# Patient Record
Sex: Male | Born: 1995 | Race: Black or African American | Hispanic: No | Marital: Single | Smoking: Former smoker
Health system: Southern US, Community
[De-identification: ages and names within clinical notes are randomized; demographics above are authoritative.]

## PROBLEM LIST (undated history)

## (undated) DIAGNOSIS — F79 Unspecified intellectual disabilities: Secondary | ICD-10-CM

## (undated) DIAGNOSIS — F909 Attention-deficit hyperactivity disorder, unspecified type: Secondary | ICD-10-CM

---
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
## 2013-01-29 ENCOUNTER — Emergency Department (HOSPITAL_COMMUNITY)
Admission: EM | Admit: 2013-01-29 | Discharge: 2013-01-29 | Disposition: A | Discharge status: Home / Self Care | Payer: Medicaid Other | Attending: Emergency Medicine | Admitting: Emergency Medicine

## 2013-01-29 ENCOUNTER — Encounter (HOSPITAL_COMMUNITY): Payer: Self-pay | Admitting: Emergency Medicine

## 2013-01-29 DIAGNOSIS — F79 Unspecified intellectual disabilities: Secondary | ICD-10-CM | POA: Insufficient documentation

## 2013-01-29 DIAGNOSIS — F919 Conduct disorder, unspecified: Secondary | ICD-10-CM | POA: Insufficient documentation

## 2013-01-29 DIAGNOSIS — Z79899 Other long term (current) drug therapy: Secondary | ICD-10-CM | POA: Insufficient documentation

## 2013-01-29 DIAGNOSIS — F909 Attention-deficit hyperactivity disorder, unspecified type: Secondary | ICD-10-CM | POA: Insufficient documentation

## 2013-01-29 DIAGNOSIS — Z760 Encounter for issue of repeat prescription: Secondary | ICD-10-CM

## 2013-01-29 MED ORDER — METHYLPHENIDATE HCL ER (OSM) 54 MG PO TBCR: 54.0000 mg | EXTENDED_RELEASE_TABLET | ORAL | Status: DC

## 2013-01-29 NOTE — ED Provider Notes (Signed)
CSN: [id number]     Arrival date & time 01/29/13  1711 History   First MD Initiated Contact with Patient 01/29/13 1829    This chart was scribed for Iona Coach, a non-physician practitioner working with Benny Lennert, MD by Lewanda Rife, ED Scribe. This patient was seen in room WTR5/WTR5 and the patient's care was started at 6:52 PM     Chief Complaint  Patient presents with  . Medication Refill   (Consider location/radiation/quality/duration/timing/severity/associated sxs/prior Treatment) The history is provided by the patient. No language interpreter was used.   HPI Comments: Raymond Rosario is a 17 y.o. male who presents to the Emergency Department with PMHx of MR and ADHD for a medication refill of concerta, which he has been out of for over a week. Patient has been on this medication for years. History provided by a group home caretaker. States he has an appointment with a new PCP in 2 weeks. Reports associated behavioral issues since running out.  Patient has no complaints.  Past Medical History  Diagnosis Date  . Mental retardation   . ADHD (attention deficit hyperactivity disorder)    History reviewed. No pertinent past surgical history. No family history on file. History  Substance Use Topics  . Smoking status: Never Smoker   . Smokeless tobacco: Not on file  . Alcohol Use: No    Review of Systems  Psychiatric/Behavioral: Positive for behavioral problems.  All other systems reviewed and are negative.    Allergies  Review of patient's allergies indicates no known allergies.  Home Medications   Current Outpatient Rx  Name  Route  Sig  Dispense  Refill  . desmopressin (DDAVP) 0.2 MG tablet   Oral   Take 0.2-0.4 mg by mouth 2 (two) times daily. Take 1 tablet every morning and 2 tablets every night         . methylphenidate (CONCERTA) 54 MG CR tablet   Oral   Take 1 tablet (54 mg total) by mouth every morning.   15 tablet   0   .  polyethylene glycol (MIRALAX / GLYCOLAX) packet   Oral   Take 17 g by mouth at bedtime.         . risperiDONE (RISPERDAL) 1 MG tablet   Oral   Take 2 mg by mouth at bedtime.         . solifenacin (VESICARE) 5 MG tablet   Oral   Take 10 mg by mouth daily.         . traZODone (DESYREL) 50 MG tablet   Oral   Take 50 mg by mouth at bedtime.          BP 124/70  Pulse 54  Temp(Src) 99 F (37.2 C) (Oral)  Resp 14  SpO2 100% Physical Exam  Nursing note and vitals reviewed. Constitutional: He is oriented to person, place, and time. He appears well-developed and well-nourished. No distress.  HENT:  Head: Normocephalic and atraumatic.  Eyes: EOM are normal.  Neck: Neck supple. No tracheal deviation present.  Cardiovascular: Normal rate and regular rhythm.   No murmur heard. Pulmonary/Chest: Effort normal and breath sounds normal. No respiratory distress.  Musculoskeletal: Normal range of motion.  Neurological: He is alert and oriented to person, place, and time.  Skin: Skin is warm and dry.  Psychiatric: He has a normal mood and affect. His behavior is normal.    ED Course  Procedures (including critical care time)  COORDINATION OF CARE:  Nursing notes  reviewed. Vital signs reviewed. Initial pt interview and examination performed.   6:52 PM-Discussed treatment plan with pt and caregiver at bedside, which includes f/u with new PCP in 2 weeks for future refills. Pt and caregiver agree with plan.    Treatment plan initiated:Medications - No data to display   Initial diagnostic testing ordered.    Labs Review Labs Reviewed - No data to display Imaging Review No results found.  EKG Interpretation   None       MDM   1. Medication refill    Will give 20 tabs of Concerta until able to followup. Patient is currently in no distress and is cooperative and calm. Plan to discharge home.  Filed Vitals:   01/29/13 1730 01/29/13 1912  BP: 124/70 119/63  Pulse:  54 66  Temp: 99 F (37.2 C) 98.2 F (36.8 C)  TempSrc: Oral Oral  Resp: 14 16  SpO2: 100% 97%    I personally performed the services described in this documentation, which was scribed in my presence. The recorded information has been reviewed and is accurate.    Lottie Mussel, PA-C 01/29/13 2038

## 2013-01-29 NOTE — ED Notes (Signed)
Pt. Needs Concerta 54mg  refilled.

## 2013-01-29 NOTE — ED Provider Notes (Signed)
Medical screening examination/treatment/procedure(s) were performed by non-physician practitioner and as supervising physician I was immediately available for consultation/collaboration.  EKG Interpretation   None         Benny Lennert, MD 01/29/13 2210

## 2013-01-29 NOTE — ED Notes (Signed)
Pt waiting for new pt appt with new PCP but not for another two weeks. Pt has been out of his concerta med for a week now.

## 2016-01-16 ENCOUNTER — Emergency Department (HOSPITAL_COMMUNITY)
Admission: EM | Admit: 2016-01-16 | Discharge: 2016-01-17 | Disposition: A | Discharge status: Home / Self Care | Payer: MEDICAID | Attending: Emergency Medicine | Admitting: Emergency Medicine

## 2016-01-16 ENCOUNTER — Encounter (HOSPITAL_COMMUNITY): Payer: Self-pay | Admitting: Emergency Medicine

## 2016-01-16 DIAGNOSIS — F909 Attention-deficit hyperactivity disorder, unspecified type: Secondary | ICD-10-CM | POA: Diagnosis not present

## 2016-01-16 DIAGNOSIS — F6381 Intermittent explosive disorder: Secondary | ICD-10-CM

## 2016-01-16 DIAGNOSIS — F79 Unspecified intellectual disabilities: Secondary | ICD-10-CM | POA: Diagnosis present

## 2016-01-16 DIAGNOSIS — F902 Attention-deficit hyperactivity disorder, combined type: Secondary | ICD-10-CM

## 2016-01-16 LAB — VALPROIC ACID LEVEL: Valproic Acid Lvl: 106 ug/mL — ABNORMAL HIGH (ref 50.0–100.0)

## 2016-01-16 LAB — RAPID URINE DRUG SCREEN, HOSP PERFORMED
AMPHETAMINES: NOT DETECTED
BARBITURATES: NOT DETECTED
Benzodiazepines: NOT DETECTED
Cocaine: NOT DETECTED
Opiates: NOT DETECTED
TETRAHYDROCANNABINOL: NOT DETECTED

## 2016-01-16 LAB — CBC WITH DIFFERENTIAL/PLATELET
BASOS ABS: 0 10*3/uL (ref 0.0–0.1)
BASOS PCT: 0 %
Eosinophils Absolute: 0.1 10*3/uL (ref 0.0–0.7)
Eosinophils Relative: 1 %
HEMATOCRIT: 34.7 % — AB (ref 39.0–52.0)
HEMOGLOBIN: 12.3 g/dL — AB (ref 13.0–17.0)
LYMPHS PCT: 41 %
Lymphs Abs: 2.3 10*3/uL (ref 0.7–4.0)
MCH: 27.6 pg (ref 26.0–34.0)
MCHC: 35.4 g/dL (ref 30.0–36.0)
MCV: 77.8 fL — ABNORMAL LOW (ref 78.0–100.0)
MONOS PCT: 10 %
Monocytes Absolute: 0.6 10*3/uL (ref 0.1–1.0)
NEUTROS ABS: 2.6 10*3/uL (ref 1.7–7.7)
NEUTROS PCT: 48 %
Platelets: 209 10*3/uL (ref 150–400)
RBC: 4.46 MIL/uL (ref 4.22–5.81)
RDW: 14 % (ref 11.5–15.5)
WBC: 5.6 10*3/uL (ref 4.0–10.5)

## 2016-01-16 LAB — COMPREHENSIVE METABOLIC PANEL
ALBUMIN: 3.9 g/dL (ref 3.5–5.0)
ALT: 33 U/L (ref 17–63)
ANION GAP: 6 (ref 5–15)
AST: 30 U/L (ref 15–41)
Alkaline Phosphatase: 57 U/L (ref 38–126)
BILIRUBIN TOTAL: 0.7 mg/dL (ref 0.3–1.2)
BUN: 5 mg/dL — ABNORMAL LOW (ref 6–20)
CHLORIDE: 97 mmol/L — AB (ref 101–111)
CO2: 27 mmol/L (ref 22–32)
Calcium: 8.8 mg/dL — ABNORMAL LOW (ref 8.9–10.3)
Creatinine, Ser: 0.87 mg/dL (ref 0.61–1.24)
GFR calc Af Amer: >60 mL/min (ref >60)
GFR calc non Af Amer: >60 mL/min (ref >60)
GLUCOSE: 80 mg/dL (ref 65–99)
POTASSIUM: 4.2 mmol/L (ref 3.5–5.1)
SODIUM: 130 mmol/L — AB (ref 135–145)
TOTAL PROTEIN: 7.3 g/dL (ref 6.5–8.1)

## 2016-01-16 LAB — ETHANOL: Alcohol, Ethyl (B): <5 mg/dL (ref <5)

## 2016-01-16 MED ORDER — IBUPROFEN 200 MG PO TABS
600.0000 mg | ORAL_TABLET | Freq: Three times a day (TID) | ORAL | Status: DC | PRN
  Administered 2016-01-17: 600 mg via ORAL
  Filled 2016-01-16: qty 3

## 2016-01-16 MED ORDER — DARIFENACIN HYDROBROMIDE ER 7.5 MG PO TB24
7.5000 mg | ORAL_TABLET | Freq: Every day | ORAL | Status: DC
  Administered 2016-01-17: 7.5 mg via ORAL
  Filled 2016-01-16: qty 1

## 2016-01-16 MED ORDER — DIVALPROEX SODIUM ER 500 MG PO TB24: 500.0000 mg | ORAL_TABLET | ORAL | Status: DC

## 2016-01-16 MED ORDER — LORATADINE 10 MG PO TABS
10.0000 mg | ORAL_TABLET | Freq: Every day | ORAL | Status: DC
  Administered 2016-01-17 (×2): 10 mg via ORAL
  Filled 2016-01-16 (×4): qty 1

## 2016-01-16 MED ORDER — CHLORHEXIDINE GLUCONATE 0.12 % MT SOLN
15.0000 mL | Freq: Two times a day (BID) | OROMUCOSAL | Status: DC
  Administered 2016-01-17 (×2): 15 mL via OROMUCOSAL
  Filled 2016-01-16 (×3): qty 15

## 2016-01-16 MED ORDER — FLUOXETINE HCL 20 MG PO CAPS
40.0000 mg | ORAL_CAPSULE | Freq: Every morning | ORAL | Status: DC
  Administered 2016-01-17: 40 mg via ORAL
  Filled 2016-01-16: qty 2

## 2016-01-16 MED ORDER — ACETAMINOPHEN 325 MG PO TABS: 650.0000 mg | ORAL_TABLET | ORAL | Status: DC | PRN

## 2016-01-16 MED ORDER — TRAZODONE HCL 100 MG PO TABS: 100.0000 mg | ORAL_TABLET | Freq: Every evening | ORAL | Status: DC | PRN

## 2016-01-16 MED ORDER — NICOTINE 7 MG/24HR TD PT24
7.0000 mg | MEDICATED_PATCH | Freq: Every day | TRANSDERMAL | Status: DC
  Administered 2016-01-16 – 2016-01-17 (×2): 7 mg via TRANSDERMAL
  Filled 2016-01-16 (×2): qty 1

## 2016-01-16 NOTE — ED Provider Notes (Signed)
[facility]-EMERGENCY DEPT Provider Note   CSN: [id number] Arrival date & time: 01/16/16  1527     History   Chief Complaint Chief Complaint  Patient presents with  . IVC'ed   Level V caveat due to mental retardation. HPI Raymond Rosario is a 20 y.o. male.  The history is provided by the patient, the police and medical records.  Patient was brought in under involuntary commitment. Paperwork states he has been combative at the group home. Involuntary committed by the group home manager. Reportedly had been fighting people. Patient denies fighting. States his been taking some medicines. Paperwork states he has not been taking his medicines.  Past Medical History:  Diagnosis Date  . ADHD (attention deficit hyperactivity disorder)   . Mental retardation     There are no active problems to display for this patient.   History reviewed. No pertinent surgical history.     Home Medications    Prior to Admission medications   Medication Sig Start Date End Date Taking? Authorizing Provider  desmopressin (DDAVP) 0.2 MG tablet Take 0.2-0.4 mg by mouth 2 (two) times daily. Take 1 tablet every morning and 2 tablets every night    Historical Provider, MD  methylphenidate (CONCERTA) 54 MG CR tablet Take 1 tablet (54 mg total) by mouth every morning. 12/07/12   Hannah Muthersbaugh, PA-C  methylphenidate (CONCERTA) 54 MG CR tablet Take 1 tablet (54 mg total) by mouth every morning. 01/29/13   [name], PA-C  polyethylene glycol (MIRALAX / GLYCOLAX) packet Take 17 g by mouth at bedtime.    Historical Provider, MD  risperiDONE (RISPERDAL) 1 MG tablet Take 2 mg by mouth at bedtime.    Historical Provider, MD  solifenacin (VESICARE) 5 MG tablet Take 10 mg by mouth daily.    Historical Provider, MD  traZODone (DESYREL) 50 MG tablet Take 50 mg by mouth at bedtime.    Historical Provider, MD    Family History No family history on file.  Social History Social History  Substance Use  Topics  . Smoking status: Never Smoker  . Smokeless tobacco: Never Used  . Alcohol use No     Allergies   Review of patient's allergies indicates no known allergies.   Review of Systems Review of Systems  Unable to perform ROS: Psychiatric disorder     Physical Exam Updated Vital Signs BP 121/74 (BP Location: Left Arm)   Pulse 84   Temp 98.4 F (36.9 C) (Oral)   Resp 16   SpO2 99%   Physical Exam  Constitutional: He appears well-developed.  HENT:  Head: Atraumatic.  Eyes: Pupils are equal, round, and reactive to light.  Neck: Neck supple.  Cardiovascular: Normal rate.   Pulmonary/Chest: Effort normal.  Abdominal: Soft.  Musculoskeletal: He exhibits no edema.  Neurological: He is alert.  Mild mental retardation.  Skin: Skin is warm. Capillary refill takes less than 2 seconds.  Psychiatric:  Patient with pleasant mood and affect.     ED Treatments / Results  Labs (all labs ordered are listed, but only abnormal results are displayed) Labs Reviewed  RAPID URINE DRUG SCREEN, HOSP PERFORMED  COMPREHENSIVE METABOLIC PANEL  ETHANOL  CBC WITH DIFFERENTIAL/PLATELET  VALPROIC ACID LEVEL    EKG  EKG Interpretation None       Radiology No results found.  Procedures Procedures (including critical care time)  Medications Ordered in ED Medications  acetaminophen (TYLENOL) tablet 650 mg (not administered)  ibuprofen (ADVIL,MOTRIN) tablet 600 mg (not administered)  Initial Impression / Assessment and Plan / ED Course  I have reviewed the triage vital signs and the nursing notes.  Pertinent labs & imaging results that were available during my care of the patient were reviewed by me and considered in my medical decision making (see chart for details).  Clinical Course    Patient with history of mental retardation. Under IVC from group home. Reportedly has been combative. Labs pending but medically cleared at this time. To be seen by TTS.  Final  Clinical Impressions(s) / ED Diagnoses   Final diagnoses:  Mental retardation    New Prescriptions New Prescriptions   No medications on file     Benjiman CoreNathan Kamica Florance, MD 01/16/16 1609
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
## 2016-01-16 NOTE — BH Assessment (Signed)
[facility] Assessment Progress Note  Case was staffed with Hobson FNP who recommended patient be re-evaluated in the a.m.    
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
## 2016-01-16 NOTE — ED Notes (Signed)
Pharmacy called for medications. 

## 2016-01-17 DIAGNOSIS — F6381 Intermittent explosive disorder: Secondary | ICD-10-CM

## 2016-01-17 DIAGNOSIS — F902 Attention-deficit hyperactivity disorder, combined type: Secondary | ICD-10-CM

## 2016-01-17 DIAGNOSIS — F79 Unspecified intellectual disabilities: Secondary | ICD-10-CM

## 2016-01-17 MED ORDER — DIVALPROEX SODIUM ER 500 MG PO TB24
500.0000 mg | ORAL_TABLET | Freq: Every day | ORAL | Status: DC
  Administered 2016-01-17: 500 mg via ORAL
  Filled 2016-01-17: qty 1

## 2016-01-17 NOTE — ED Notes (Signed)
Patient given change of clothes and full linen change after having an episode of incontinence.

## 2016-01-17 NOTE — ED Notes (Signed)
Patient came up to nurse's station and asked if he could call his mother.  Nurse not able to find phone number or any information about his mother.  Patient unaware of mother's number.  Patient currently lives in a facility but told nurse he lives with his mother.

## 2016-01-17 NOTE — ED Notes (Signed)
Patient c/o lower back pain.  Gave patient Motrin 600mg  per prn order.
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
## 2016-01-17 NOTE — ED Notes (Signed)
Patient taking bath with sitter's assistance.

## 2016-01-17 NOTE — Progress Notes (Signed)
CSW contacted Non-emergent police after being unsuccessful in reaching the supervisor from the group home to pick patient up [facility]. Non - emergent police reported that they were unable to get in contact with the group home manager. Non-emergent police reported that they "would try to figure something out" and would return phone call to CSW. CSW provided non-emergent police with contact information.

## 2016-01-17 NOTE — [facility] Suicide Risk Assessment (Signed)
Suicide Risk Assessment  Discharge Assessment   [facility]   Principal Problem: Intermittent explosive disorder Discharge Diagnoses:  Patient Active Problem List   Diagnosis Date Noted  . ADHD (attention deficit hyperactivity disorder), combined type [F90.2] 01/17/2016  . Intellectual disability [F79] 01/17/2016  . Intermittent explosive disorder [F63.81] 01/17/2016    Total Time spent with patient: 15 minutes  Musculoskeletal: Strength & Muscle Tone: within normal limits Gait & Station: normal Patient leans: N/A  Psychiatric Specialty Exam:   Blood pressure 123/69, pulse 74, temperature 97.5 F (36.4 C), temperature source Oral, resp. rate 16, SpO2 100 %.There is no height or weight on file to calculate BMI.   General Appearance: Fairly Groomed  Eye Contact:  Fair  Speech:  Garbled and Slow  Volume:  Decreased  Mood:  Anxious  Affect:  Congruent  Thought Process:  Coherent  Orientation:  Other:  oriented to person  Thought Content:  Illogical  Suicidal Thoughts:  No  Homicidal Thoughts:  No  Memory:  Immediate;   Poor Recent;   Poor  Judgement:  Poor  Insight:  Lacking  Psychomotor Activity:  Normal  Concentration:  Concentration: Fair and Attention Span: Fair  Recall:  Poor  Fund of Knowledge:  Poor  Language:  Poor  Akathisia:  No  Handed:  Right  AIMS (if indicated):     Assets:  Financial Resources/Insurance Housing Social Support  ADL's:  Impaired  Cognition:  Impaired,  Severe  Sleep:      Mental Status Per Nursing Assessment::   On Admission:     Demographic Factors:  Male, Adolescent or young adult, Low socioeconomic status and Unemployed  Loss Factors: NA  Historical Factors: NA  Risk Reduction Factors:   Living with another person, especially a relative and Positive social support   Cognitive Features That Contribute To Risk:  Loss of executive function    Suicide Risk:  Minimal: No identifiable suicidal  ideation.  Patients presenting with no risk factors but with morbid ruminations; may be classified as minimal risk based on the severity of the depressive symptoms     Plan Of Care/Follow-up recommendations:  Activity:  As tolerated Diet:  regular Tests:  as determined by PCP  1.Take all your medications as prescribed.  2. Report any adverse side effects to your medication to your outpatient provider. 3. Do not use alcohol or illegal drugs while taking prescription medications. 4. In the event of worsening symptoms, call 911, the crisis hotline or go to nearest emergency room for evaluation of symptoms.  Alberteen SamFran Deyton Ellenbecker, FNP-BC [facility] 01/17/2016, 11:10 AM
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
## 2016-01-17 NOTE — Progress Notes (Signed)
CSW contacted by officer that responded to group home to inquire about staff's ability to pick up patient. Officer reported to CSW that employee stated that employee had no means of transportation and that staff member was unaware if the group home was taking the patient back into the facility. CSW informed officer that group home has to take patient back into the facility unless the patient has been provided with a 30 day eviction notice, which hospital has no record of. Officer agreed to relay information to group home staff member and call CSW after speaking with group home staff member. Officer returned phone call and reported that the officer would pick patient up from the ED and transport patient back to the group home.   CSW notified patient that police officers would be coming to transport patient back to group home. CSW explained to patient that the group home did not have a way to pick up the patient and that the officers were able to give patient a ride back to the group home. CSW inquired if patient had any questions, patient replied no.

## 2016-01-17 NOTE — Progress Notes (Addendum)
CSW contacted patient's group home([facility] [phone]) to notify staff that patient was being discharged and needed transportation to return to facility. CSW spoke with employee from facility, employee informed CSW that employee's supervisor would return phone call and inform CSW about transportation for the patient.    CSW contacted patient's group home [facility] [phone]) after not receiving a return phone call from the employee's supervisor. Employee provided CSW with supervisor's name and phone number [employer](Alex Young [phone]). CSW thanked employee for information.   CSW contacted [employer](Alex Young [phone]), CSW received no answer.   CSW attempted to contact [employer] again after not receiving a return phone call, CSW received no answer. CSW left message introducing self and requesting return phone call to discuss patient discharge.

## 2016-01-17 NOTE — ED Notes (Signed)
Full linen change, full clothing change for patient due to patient having an episode of incontinence.

## 2016-01-17 NOTE — Discharge Instructions (Signed)
Intermittent Explosive Disorder Intermittent explosive disorder is a mental illness. With this disorder, you have trouble controlling angry impulses. You repeatedly have sudden episodes of aggression (explosive rages). The rages are unplanned and out of proportion to the situation. They can result in serious injury to people and animals or damage to property. Intermittent explosive disorder may interfere with personal relationships, school, or work. It may cause legal or financial problems. Some people with intermittent explosive disorder feel guilty or embarrassed after a rage. Intermittent explosive disorder is not diagnosed before age 65 years and most commonly starts in the teenage years. The disorder may go away after 10 or 20 years or may last for life. It is more common in men than women. This disorder is also more common in people with a history of physical abuse or with family members who have the disorder. With intermittent explosive disorder, you may often have one or more other mental disorders, including other impulse control disorders, depression, anxiety, and substance abuse. You are also at risk for self-injury and suicide attempts. SIGNS AND SYMPTOMS  Explosive rages may be verbal, physical, or both. Examples of verbal rages may include:  Temper tantrums after the age of 6 years.  Verbal arguments or fights.  Vicious name calling, cursing, or insulting others.  Road rage. Physical rages are directed at people, animals, or property. They may or may not cause injury or damage. Examples of physical aggression may include:  Pushing, slapping, punching, choking, or kicking.  Punching holes in walls, breaking furniture, or throwing items. Verbal rages and nondamaging physical rages occur twice a week on average for 3 months or longer. Rages causing physical injury or property damage occur at least three times within a 6442-month period. Rages usually last less than 30 minutes. In between  rages, you may have no signs of anger, or you may be irritable. Before or during an episode you may have one of the following sensations:  Tingling.  Tremors.  Pounding heart.  Chest tightness.  Head pressure.  Hearing an echo. DIAGNOSIS  Intermittent explosive disorder is diagnosed through an assessment by your health care provider. He or she will ask questions about your rages and how they affect your life. Your health care provider may also ask about:  Your mood.  Your thoughts.  Your other behaviors.  Your medical history.  Your use of medicines or drugs. Your health care provider may do a physical exam and order lab tests or brain imaging studies. Similar symptoms may occur with a number of other mental illnesses, certain brain disorders, and the use of certain substances. Your health care provider may refer you to a mental health professional for evaluation. TREATMENT  Treatment for intermittent explosive disorder is provided by mental health specialists. It includes the following options:  Counseling or talk therapy. One form of talk therapy focuses on your underlying feelings and reasons that lead to rages. Another form of talk therapy looks at your triggers for rages and ways to prevent or control them.  Support groups. These provide emotional support and advice from other people with anger management problems.  Medicines. Several different types of medicine may help reduce the frequency and severity of explosive rages. These include certain antidepressants, mood stabilizers, anticonvulsants, major tranquilizers (antipsychotics), and beta blockers. The most effective treatment is usually a combination of counseling, group support, and medicine. HOME CARE INSTRUCTIONS   When possible, leave or avoid situations that upset you.  Keep all follow-up visits as directed by  your health care provider.  Take medicines only as directed by your health care provider. SEEK  MEDICAL CARE IF:   Your symptoms get worse.  You are not able to take your medicine as told. SEEK IMMEDIATE MEDICAL CARE IF:   You have serious thoughts about hurting yourself or others.  You have seriously injured yourself or someone else.   This information is not intended to replace advice given to you by your health care provider. Make sure you discuss any questions you have with your health care provider.   Document Released: 06/15/2007 Document Revised: 03/29/2014 Document Reviewed: 05/04/2013 Elsevier Interactive Patient Education [employer]2016 [employer] Inc.

## 2016-01-18 ENCOUNTER — Emergency Department (HOSPITAL_COMMUNITY)
Admission: EM | Admit: 2016-01-18 | Discharge: 2016-01-18 | Disposition: A | Discharge status: Home / Self Care | Payer: Medicaid Other | Attending: Emergency Medicine | Admitting: Emergency Medicine

## 2016-01-18 ENCOUNTER — Encounter (HOSPITAL_COMMUNITY): Payer: Self-pay

## 2016-01-18 DIAGNOSIS — Z79899 Other long term (current) drug therapy: Secondary | ICD-10-CM | POA: Insufficient documentation

## 2016-01-18 DIAGNOSIS — S299XXA Unspecified injury of thorax, initial encounter: Secondary | ICD-10-CM | POA: Diagnosis present

## 2016-01-18 DIAGNOSIS — S20312A Abrasion of left front wall of thorax, initial encounter: Secondary | ICD-10-CM | POA: Insufficient documentation

## 2016-01-18 DIAGNOSIS — Y999 Unspecified external cause status: Secondary | ICD-10-CM | POA: Insufficient documentation

## 2016-01-18 DIAGNOSIS — F918 Other conduct disorders: Secondary | ICD-10-CM | POA: Insufficient documentation

## 2016-01-18 DIAGNOSIS — X58XXXA Exposure to other specified factors, initial encounter: Secondary | ICD-10-CM | POA: Insufficient documentation

## 2016-01-18 DIAGNOSIS — Y939 Activity, unspecified: Secondary | ICD-10-CM | POA: Diagnosis not present

## 2016-01-18 DIAGNOSIS — Y929 Unspecified place or not applicable: Secondary | ICD-10-CM | POA: Insufficient documentation

## 2016-01-18 DIAGNOSIS — F909 Attention-deficit hyperactivity disorder, unspecified type: Secondary | ICD-10-CM | POA: Insufficient documentation

## 2016-01-18 DIAGNOSIS — R4689 Other symptoms and signs involving appearance and behavior: Secondary | ICD-10-CM

## 2016-01-18 DIAGNOSIS — S40811A Abrasion of right upper arm, initial encounter: Secondary | ICD-10-CM | POA: Insufficient documentation

## 2016-01-18 NOTE — ED Notes (Signed)
Bed: [facility] Expected date:  Expected time:  Means of arrival:  Comments: 

## 2016-01-18 NOTE — ED Notes (Signed)
Patient is continuing to stand at the door even after being directed back into room multiple times.

## 2016-01-18 NOTE — ED Notes (Signed)
Patient has been given another sprite and a warm blanket. Patient still trying to wander around the triage area. Patent also asking to go to a bed and lay down. This tech has informed patient that he needs to stay in room till his ride comes to pick him up. I have also informed patient that he can have water but no more sodas.
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
## 2016-01-18 NOTE — ED Notes (Signed)
EDP reports she has cleared the Pt and will put him up for d/c.  This Clinical [facility] associatewriter called [facility].  Staff sts that they currently do not have anyone to come and get him.  Sts he will call the director and call us back.    Pt given a sandwich and Sprite.
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
## 2016-01-20 ENCOUNTER — Emergency Department (HOSPITAL_COMMUNITY)
Admission: EM | Admit: 2016-01-20 | Discharge: 2016-01-20 | Disposition: A | Discharge status: Home / Self Care | Payer: Medicaid Other | Attending: Emergency Medicine | Admitting: Emergency Medicine

## 2016-01-20 ENCOUNTER — Encounter (HOSPITAL_COMMUNITY): Payer: Self-pay

## 2016-01-20 DIAGNOSIS — R0789 Other chest pain: Secondary | ICD-10-CM | POA: Diagnosis present

## 2016-01-20 DIAGNOSIS — F79 Unspecified intellectual disabilities: Secondary | ICD-10-CM | POA: Insufficient documentation

## 2016-01-20 DIAGNOSIS — F909 Attention-deficit hyperactivity disorder, unspecified type: Secondary | ICD-10-CM | POA: Insufficient documentation

## 2016-01-20 DIAGNOSIS — Y939 Activity, unspecified: Secondary | ICD-10-CM | POA: Insufficient documentation

## 2016-01-20 DIAGNOSIS — S00511A Abrasion of lip, initial encounter: Secondary | ICD-10-CM

## 2016-01-20 DIAGNOSIS — Z79899 Other long term (current) drug therapy: Secondary | ICD-10-CM | POA: Insufficient documentation

## 2016-01-20 DIAGNOSIS — Y92410 Unspecified street and highway as the place of occurrence of the external cause: Secondary | ICD-10-CM | POA: Insufficient documentation

## 2016-01-20 DIAGNOSIS — Y999 Unspecified external cause status: Secondary | ICD-10-CM | POA: Diagnosis not present

## 2016-01-20 NOTE — ED Notes (Signed)
Group Home called and explained that the patient was being discharged. Group home to pick the patient up.
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
## 2016-01-29 ENCOUNTER — Encounter (HOSPITAL_COMMUNITY): Payer: Self-pay

## 2016-01-29 ENCOUNTER — Emergency Department (HOSPITAL_COMMUNITY)
Admission: EM | Admit: 2016-01-29 | Discharge: 2016-01-29 | Disposition: A | Discharge status: Home / Self Care | Payer: Medicaid Other | Attending: Emergency Medicine | Admitting: Emergency Medicine

## 2016-01-29 DIAGNOSIS — W500XXA Accidental hit or strike by another person, initial encounter: Secondary | ICD-10-CM | POA: Insufficient documentation

## 2016-01-29 DIAGNOSIS — Y939 Activity, unspecified: Secondary | ICD-10-CM | POA: Insufficient documentation

## 2016-01-29 DIAGNOSIS — M545 Low back pain, unspecified: Secondary | ICD-10-CM

## 2016-01-29 DIAGNOSIS — Y92009 Unspecified place in unspecified non-institutional (private) residence as the place of occurrence of the external cause: Secondary | ICD-10-CM | POA: Diagnosis not present

## 2016-01-29 DIAGNOSIS — F909 Attention-deficit hyperactivity disorder, unspecified type: Secondary | ICD-10-CM | POA: Diagnosis not present

## 2016-01-29 DIAGNOSIS — Y999 Unspecified external cause status: Secondary | ICD-10-CM | POA: Insufficient documentation

## 2016-01-29 MED ORDER — IBUPROFEN 200 MG PO TABS
600.0000 mg | ORAL_TABLET | Freq: Once | ORAL | Status: AC
  Administered 2016-01-29: 600 mg via ORAL
  Filled 2016-01-29: qty 3
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
## 2016-01-29 NOTE — ED Notes (Signed)
Social work consulted and group home contacted to pick up pt.

## 2016-01-29 NOTE — Progress Notes (Signed)
[facility]
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
## 2016-01-29 NOTE — Discharge Instructions (Signed)
Performed the listed exercises. Naproxen and ibuprofen for pain. Follow up with a primary care provider for further management.

## 2016-03-22 ENCOUNTER — Encounter (HOSPITAL_COMMUNITY): Payer: Self-pay | Admitting: Emergency Medicine

## 2016-03-22 ENCOUNTER — Emergency Department (HOSPITAL_COMMUNITY)
Admission: EM | Admit: 2016-03-22 | Discharge: 2016-03-22 | Disposition: A | Discharge status: Home / Self Care | Payer: MEDICAID | Attending: Emergency Medicine | Admitting: Emergency Medicine

## 2016-03-22 DIAGNOSIS — F79 Unspecified intellectual disabilities: Secondary | ICD-10-CM | POA: Insufficient documentation

## 2016-03-22 DIAGNOSIS — F6381 Intermittent explosive disorder: Secondary | ICD-10-CM | POA: Insufficient documentation

## 2016-03-22 DIAGNOSIS — Z79899 Other long term (current) drug therapy: Secondary | ICD-10-CM | POA: Insufficient documentation

## 2016-03-22 DIAGNOSIS — R45851 Suicidal ideations: Secondary | ICD-10-CM

## 2016-03-22 DIAGNOSIS — F902 Attention-deficit hyperactivity disorder, combined type: Secondary | ICD-10-CM

## 2016-03-22 LAB — CBC
HCT: 41.6 % (ref 39.0–52.0)
Hemoglobin: 14.4 g/dL (ref 13.0–17.0)
MCH: 27.4 pg (ref 26.0–34.0)
MCHC: 34.6 g/dL (ref 30.0–36.0)
MCV: 79.2 fL (ref 78.0–100.0)
PLATELETS: 221 10*3/uL (ref 150–400)
RBC: 5.25 MIL/uL (ref 4.22–5.81)
RDW: 13.7 % (ref 11.5–15.5)
WBC: 4.6 10*3/uL (ref 4.0–10.5)

## 2016-03-22 LAB — COMPREHENSIVE METABOLIC PANEL
ALBUMIN: 4.2 g/dL (ref 3.5–5.0)
ALK PHOS: 82 U/L (ref 38–126)
ALT: 57 U/L (ref 17–63)
ANION GAP: 9 (ref 5–15)
AST: 39 U/L (ref 15–41)
BILIRUBIN TOTAL: 0.5 mg/dL (ref 0.3–1.2)
BUN: 17 mg/dL (ref 6–20)
CALCIUM: 9.6 mg/dL (ref 8.9–10.3)
CO2: 29 mmol/L (ref 22–32)
Chloride: 98 mmol/L — ABNORMAL LOW (ref 101–111)
Creatinine, Ser: 0.86 mg/dL (ref 0.61–1.24)
GFR calc non Af Amer: >60 mL/min (ref >60)
GLUCOSE: 92 mg/dL (ref 65–99)
Potassium: 4.1 mmol/L (ref 3.5–5.1)
Sodium: 136 mmol/L (ref 135–145)
TOTAL PROTEIN: 7.5 g/dL (ref 6.5–8.1)

## 2016-03-22 LAB — RAPID URINE DRUG SCREEN, HOSP PERFORMED
Amphetamines: NOT DETECTED
BARBITURATES: NOT DETECTED
BENZODIAZEPINES: POSITIVE — AB
COCAINE: NOT DETECTED
OPIATES: NOT DETECTED
Tetrahydrocannabinol: NOT DETECTED

## 2016-03-22 LAB — ACETAMINOPHEN LEVEL

## 2016-03-22 LAB — SALICYLATE LEVEL

## 2016-03-22 LAB — ETHANOL

## 2016-03-22 MED ORDER — HYDROXYZINE HCL 25 MG PO TABS: 25.0000 mg | ORAL_TABLET | Freq: Three times a day (TID) | ORAL | Status: DC | PRN

## 2016-03-22 MED ORDER — ACETAMINOPHEN 325 MG PO TABS: 650.0000 mg | ORAL_TABLET | ORAL | Status: DC | PRN

## 2016-03-22 MED ORDER — NICOTINE 21 MG/24HR TD PT24: 21.0000 mg | MEDICATED_PATCH | Freq: Every day | TRANSDERMAL | Status: DC | PRN

## 2016-03-22 MED ORDER — ALUM & MAG HYDROXIDE-SIMETH 200-200-20 MG/5ML PO SUSP: 30.0000 mL | ORAL | Status: DC | PRN

## 2016-03-22 MED ORDER — HYDROXYZINE HCL 25 MG PO TABS: 25.0000 mg | ORAL_TABLET | Freq: Two times a day (BID) | ORAL | 0 refills | Status: DC | PRN

## 2016-03-22 MED ORDER — METHYLPHENIDATE HCL ER (OSM) 27 MG PO TBCR: 54.0000 mg | EXTENDED_RELEASE_TABLET | ORAL | Status: DC

## 2016-03-22 MED ORDER — DIVALPROEX SODIUM ER 500 MG PO TB24: 500.0000 mg | ORAL_TABLET | ORAL | Status: DC

## 2016-03-22 MED ORDER — ONDANSETRON HCL 4 MG PO TABS: 4.0000 mg | ORAL_TABLET | Freq: Three times a day (TID) | ORAL | Status: DC | PRN

## 2016-03-22 MED ORDER — IBUPROFEN 200 MG PO TABS: 600.0000 mg | ORAL_TABLET | Freq: Three times a day (TID) | ORAL | Status: DC | PRN

## 2016-03-22 MED ORDER — ZOLPIDEM TARTRATE 5 MG PO TABS: 5.0000 mg | ORAL_TABLET | Freq: Every evening | ORAL | Status: DC | PRN

## 2016-03-22 MED ORDER — TRAZODONE HCL 100 MG PO TABS: 100.0000 mg | ORAL_TABLET | Freq: Every evening | ORAL | Status: DC | PRN

## 2016-03-22 MED ORDER — DESMOPRESSIN ACETATE 0.2 MG PO TABS: 0.2000 mg | ORAL_TABLET | Freq: Two times a day (BID) | ORAL | Status: DC

## 2016-03-22 MED ORDER — HYDROXYZINE HCL 25 MG PO TABS: 25.0000 mg | ORAL_TABLET | Freq: Two times a day (BID) | ORAL | Status: DC | PRN

## 2016-03-22 MED ORDER — FLUOXETINE HCL 20 MG PO CAPS
40.0000 mg | ORAL_CAPSULE | Freq: Every morning | ORAL | Status: DC
  Administered 2016-03-22: 40 mg via ORAL
  Filled 2016-03-22: qty 2

## 2016-03-22 MED ORDER — DIPHENHYDRAMINE HCL 25 MG PO CAPS: 50.0000 mg | ORAL_CAPSULE | Freq: Every day | ORAL | Status: DC

## 2016-03-22 MED ORDER — CHLORPROMAZINE HCL 25 MG PO TABS: 100.0000 mg | ORAL_TABLET | ORAL | Status: DC
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
## 2016-03-22 NOTE — ED Notes (Signed)
Bed: WA26 Expected date:  Expected time:  Means of arrival:  Comments: 
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
## 2016-03-22 NOTE — ED Triage Notes (Signed)
PT presented from group home by [facility] for evaluation of suicidal ideation. [employer] reported that pt had locked himself in bathroom with ideation to harm himself with a knife. Pt currently reporting SI but no HI.

## 2016-03-22 NOTE — ED Notes (Signed)
Visitors at bedside.  Patient calm and cooperative.

## 2016-08-13 ENCOUNTER — Ambulatory Visit (HOSPITAL_COMMUNITY)
Admission: RE | Admit: 2016-08-13 | Discharge: 2016-08-13 | Disposition: A | Discharge status: Home / Self Care | Payer: MEDICAID | Attending: Psychiatry | Admitting: Psychiatry

## 2016-08-13 NOTE — H&P (Signed)
Behavioral Health Medical Screening Exam  Raymond MowBrandon Rosario is an 21 y.o. male with passive suicidal ideation without a paln or intent. Pt has IQ of 41 and lives in a group home.   Total Time spent with patient: 30 minutes  Psychiatric Specialty Exam: Physical Exam  Constitutional: He is oriented to person, place, and time. He appears well-developed and well-nourished.  HENT:  Head: Normocephalic.  Right Ear: External ear normal.  Left Ear: External ear normal.  Neck: Normal range of motion.  Cardiovascular: Normal rate, normal heart sounds and intact distal pulses.   Respiratory: Effort normal and breath sounds normal.  GI: Soft. Bowel sounds are normal.  Musculoskeletal: Normal range of motion.  Neurological: He is alert and oriented to person, place, and time.  Skin: Skin is warm and dry.    Review of Systems  Psychiatric/Behavioral: Positive for depression. Negative for hallucinations, memory loss and substance abuse. Suicidal ideas: passive, no plan. The patient is not nervous/anxious and does not have insomnia.   All other systems reviewed and are negative.   Blood pressure 112/63, pulse 69, temperature 97.6 F (36.4 C), temperature source Oral, resp. rate 18, SpO2 100 %.There is no height or weight on file to calculate BMI.  General Appearance: Casual and Fairly Groomed  Eye Contact:  Poor  Speech:  Slow  Volume:  Decreased  Mood:  Depressed  Affect:  Congruent and Depressed  Thought Process:  Coherent  Orientation:  Other:  place and person  Thought Content:  Illogical  Suicidal Thoughts:  Yes.  without intent/plan  Homicidal Thoughts:  No  Memory:  Immediate;   Poor Recent;   Poor Remote;   Poor  Judgement:  Poor  Insight:  Shallow  Psychomotor Activity:  Decreased  Concentration: Concentration: Poor and Attention Span: Poor  Recall:  Poor  Fund of Knowledge:Poor  Language: Fair  Akathisia:  No  Handed:  Right  AIMS (if indicated):     Assets:  Financial  Resources/Insurance Housing Social Support  Sleep:       Musculoskeletal: Strength & Muscle Tone: within normal limits Gait & Station: normal Patient leans: N/A  Blood pressure 112/63, pulse 69, temperature 97.6 F (36.4 C), temperature source Oral, resp. rate 18, SpO2 100 %.  Recommendations:  Based on my evaluation the patient does not appear to have an emergency medical condition.  Laveda AbbeLaurie Britton Parks, NP 08/13/2016, 3:38 PM

## 2016-08-13 NOTE — BH Assessment (Signed)
Assessment Note  Raymond Rosario is an 21 y.o. male that reports passive suicidal ideation.  When asked what he would do to harm himself patient stated in a low voice that he would cut his neck.  When I asked him to repeat what he said to ensure that I was hearing him correctly the patient then would not state that he had a plan to harm himself.   Per patient group home worker the patient has an IQ of 1441 with a primary diagnosis of IDD and he often speaks in a very low voice that is hard to understand.  Patient last inpatient hospitalization was five years ago at [facility].    Patient denies HI/Psychosis/Substance Abuse.  Patient resides in a facility for IDD patients and receives 24 hour supervision at the facility.    Diagnosis: IDD  Past Medical History:  Past Medical History:  Diagnosis Date  . ADHD (attention deficit hyperactivity disorder)   . Mental retardation     No past surgical history on file.  Family History: No family history on file.  Social History:  reports that he has never smoked. He has never used smokeless tobacco. He reports that he does not drink alcohol or use drugs.  Additional Social History:  Alcohol / Drug Use History of alcohol / drug use?: No history of alcohol / drug abuse  CIWA:   COWS:    Allergies: No Known Allergies  Home Medications:  (Not in a hospital admission)  OB/GYN Status:  No LMP for male patient.  General Assessment Data [facility] of Assessment[facility] ) TTS Assessment: In system Is this a Tele or Face-to-Face Assessment?: Face-to-Face Is this an Initial Assessment or a Re-assessment for this encounter?: Initial Assessment Marital status: Single Maiden name: NA Is patient pregnant?: No Pregnancy Status: No Living Arrangements: Group Home (Living in the facility for 5 years ) Can pt return to current living arrangement?: Yes Admission Status: Voluntary Is patient capable of signing  voluntary admission?: Yes Referral Source: Self/Family/Friend Insurance type: Medicaid  Medical Screening Exam [facility] Walk-[facility] ONLY) Medical Exam completed: Yes Jacki Cones(Laurie, NP completed the assessment)  Crisis Care Plan Living Arrangements: Group Home (Living in the facility for 5 years ) Legal Guardian:  (NA) Name of Psychiatrist: Group Home  Name of Therapist: None LIsted  Education Status Is patient currently in school?: No Current Grade: NA Highest grade of school patient has completed: NA Name of school: NA Contact person: NA  Risk to self with the past 6 months Suicidal Ideation: Yes-Currently Present Has patient been a risk to self within the past 6 months prior to admission? : No Suicidal Intent: No Has patient had any suicidal intent within the past 6 months prior to admission? : No Is patient at risk for suicide?: No Suicidal Plan?: No Has patient had any suicidal plan within the past 6 months prior to admission? : No Access to Means: No What has been your use of drugs/alcohol within the last 12 months?: NA Previous Attempts/Gestures: Yes How many times?: 1 Other Self Harm Risks: NA Triggers for Past Attempts: None known Intentional Self Injurious Behavior: None Family Suicide History: No Recent stressful life event(s):  (None Reported) Persecutory voices/beliefs?: No Depression: No Depression Symptoms:  (None Reported) Substance abuse history and/or treatment for substance abuse?: No Suicide prevention information given to non-admitted patients: Not applicable  Risk to Others within the past 6 months Homicidal Ideation: No Does patient have any  lifetime risk of violence toward others beyond the six months prior to admission? : No Thoughts of Harm to Others: No Current Homicidal Intent: No Current Homicidal Plan: No Access to Homicidal Means: No Identified Victim: None Reported History of harm to others?: No Assessment of Violence: None Noted Violent Behavior  Description: None Reported Does patient have access to weapons?: No Criminal Charges Pending?: No Does patient have a court date: No Is patient on probation?: No  Psychosis Hallucinations: None noted Delusions: None noted  Mental Status Report Appearance/Hygiene: Disheveled Eye Contact: Good Motor Activity: Freedom of movement Speech: Logical/coherent Level of Consciousness: Alert Mood: Depressed Affect: Appropriate to circumstance Anxiety Level: None Thought Processes: Coherent, Relevant Judgement: Unimpaired Orientation: Person, Place, Time, Situation Obsessive Compulsive Thoughts/Behaviors: None  Cognitive Functioning Concentration: Decreased Memory: Recent Intact, Remote Intact IQ: Below Average Level of Function: 41, per Group HOme worker  Insight: Fair Impulse Control: Poor Appetite: Poor Weight Loss: 0 Weight Gain: 0 Sleep: No Change Total Hours of Sleep: 8 Vegetative Symptoms: None  ADLScreening [facility] Assessment Services) Patient's cognitive ability adequate to safely complete daily activities?: Yes Patient able to express need for assistance with ADLs?: Yes Independently performs ADLs?: Yes (appropriate for developmental age)  Prior Inpatient Therapy Prior Inpatient Therapy: Yes Prior Therapy Dates: 2013 Prior Therapy Facilty/Provider(s): [facility] Reason for Treatment: Si   Prior Outpatient Therapy Prior Outpatient Therapy: Yes Prior Therapy Dates: Ongoing  Prior Therapy Facilty/Provider(s): Group Home Reason for Treatment: Medication Management  Does patient have an ACCT team?: No Does patient have Intensive In-House Services?  : No Does patient have [facility] services? : No Does patient have P4CC services?: No  ADL Screening (condition at time of admission) Patient's cognitive ability adequate to safely complete daily activities?: Yes Is the patient deaf or have difficulty hearing?: No Does the patient have difficulty seeing, even when wearing  glasses/contacts?: No Does the patient have difficulty concentrating, remembering, or making decisions?: Yes Patient able to express need for assistance with ADLs?: Yes Does the patient have difficulty dressing or bathing?: No Independently performs ADLs?: Yes (appropriate for developmental age) Does the patient have difficulty walking or climbing stairs?: No Weakness of Legs: None Weakness of Arms/Hands: None  Home Assistive Devices/Equipment Home Assistive Devices/Equipment: None    Abuse/Neglect Assessment (Assessment to be complete while patient is alone) Physical Abuse: Denies Verbal Abuse: Denies Sexual Abuse: Denies Exploitation of patient/patient's resources: Denies Self-Neglect: Denies Values / Beliefs Cultural Requests During Hospitalization: None Spiritual Requests During Hospitalization: None Consults Spiritual Care Consult Needed: No Social Work Consult Needed: No Merchant navy officer (For Healthcare) Does Patient Have a Medical Advance Directive?: No Would patient like information on creating a medical advance directive?: No - Patient declined    Additional Information 1:1 In Past 12 Months?: No CIRT Risk: No Elopement Risk: No Does patient have medical clearance?: Yes     Disposition: Per Jacki Cones, NP - patient does not meet criteria for inpatient hospitalization. Writer provided group home worker with resources.  Disposition Initial Assessment Completed for this Encounter: Yes Disposition of Patient: Outpatient treatment  On Site Evaluation by:   Reviewed with Physician:    Phillip Heal LaVerne 08/13/2016 10:48 AM

## 2016-12-15 ENCOUNTER — Encounter (HOSPITAL_COMMUNITY): Payer: Self-pay | Admitting: *Deleted

## 2016-12-15 ENCOUNTER — Emergency Department (HOSPITAL_COMMUNITY)
Admission: EM | Admit: 2016-12-15 | Discharge: 2016-12-16 | Disposition: A | Discharge status: Home / Self Care | Payer: Medicaid Other | Attending: Emergency Medicine | Admitting: Emergency Medicine

## 2016-12-15 DIAGNOSIS — Z79899 Other long term (current) drug therapy: Secondary | ICD-10-CM | POA: Insufficient documentation

## 2016-12-15 DIAGNOSIS — F6381 Intermittent explosive disorder: Secondary | ICD-10-CM | POA: Diagnosis not present

## 2016-12-15 DIAGNOSIS — F909 Attention-deficit hyperactivity disorder, unspecified type: Secondary | ICD-10-CM | POA: Diagnosis not present

## 2016-12-15 DIAGNOSIS — R4589 Other symptoms and signs involving emotional state: Secondary | ICD-10-CM | POA: Diagnosis present

## 2016-12-15 DIAGNOSIS — R4689 Other symptoms and signs involving appearance and behavior: Secondary | ICD-10-CM

## 2016-12-15 LAB — COMPREHENSIVE METABOLIC PANEL
ALBUMIN: 4 g/dL (ref 3.5–5.0)
ALK PHOS: 57 U/L (ref 38–126)
ALT: 42 U/L (ref 17–63)
ANION GAP: 9 (ref 5–15)
AST: 33 U/L (ref 15–41)
BUN: 16 mg/dL (ref 6–20)
CHLORIDE: 105 mmol/L (ref 101–111)
CO2: 26 mmol/L (ref 22–32)
Calcium: 9.5 mg/dL (ref 8.9–10.3)
Creatinine, Ser: 0.92 mg/dL (ref 0.61–1.24)
GFR calc non Af Amer: >60 mL/min (ref >60)
GLUCOSE: 92 mg/dL (ref 65–99)
POTASSIUM: 3.8 mmol/L (ref 3.5–5.1)
SODIUM: 140 mmol/L (ref 135–145)
Total Bilirubin: 0.7 mg/dL (ref 0.3–1.2)
Total Protein: 7.5 g/dL (ref 6.5–8.1)

## 2016-12-15 LAB — SALICYLATE LEVEL

## 2016-12-15 LAB — CBC
HEMATOCRIT: 38.5 % — AB (ref 39.0–52.0)
HEMOGLOBIN: 13.3 g/dL (ref 13.0–17.0)
MCH: 26.2 pg (ref 26.0–34.0)
MCHC: 34.5 g/dL (ref 30.0–36.0)
MCV: 75.9 fL — ABNORMAL LOW (ref 78.0–100.0)
Platelets: 217 10*3/uL (ref 150–400)
RBC: 5.07 MIL/uL (ref 4.22–5.81)
RDW: 14.4 % (ref 11.5–15.5)
WBC: 7.2 10*3/uL (ref 4.0–10.5)

## 2016-12-15 LAB — ETHANOL: Alcohol, Ethyl (B): <10 mg/dL (ref <10)

## 2016-12-15 LAB — ACETAMINOPHEN LEVEL

## 2016-12-15 MED ORDER — IBUPROFEN 200 MG PO TABS: 600.0000 mg | ORAL_TABLET | Freq: Three times a day (TID) | ORAL | Status: DC | PRN

## 2016-12-15 MED ORDER — ONDANSETRON HCL 4 MG PO TABS: 4.0000 mg | ORAL_TABLET | Freq: Three times a day (TID) | ORAL | Status: DC | PRN

## 2016-12-15 MED ORDER — ALUM & MAG HYDROXIDE-SIMETH 200-200-20 MG/5ML PO SUSP: 30.0000 mL | Freq: Four times a day (QID) | ORAL | Status: DC | PRN

## 2016-12-15 MED ORDER — TRAZODONE HCL 100 MG PO TABS: 100.0000 mg | ORAL_TABLET | Freq: Every evening | ORAL | Status: DC | PRN

## 2016-12-15 MED ORDER — DIVALPROEX SODIUM ER 500 MG PO TB24: 500.0000 mg | ORAL_TABLET | ORAL | Status: DC
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
## 2016-12-16 LAB — RAPID URINE DRUG SCREEN, HOSP PERFORMED
Amphetamines: NOT DETECTED
BARBITURATES: NOT DETECTED
Benzodiazepines: NOT DETECTED
Cocaine: NOT DETECTED
Opiates: NOT DETECTED
TETRAHYDROCANNABINOL: NOT DETECTED
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
## 2016-12-16 NOTE — Progress Notes (Signed)
Patient discharged with Con Memos [phone] group home owner of [facility] [phone]. Con Memos voiced concerns with patient being discharged without any medications changes however was agreeable to pick patient up at this time.   Stacy Gardner, [facility] Clinical Social Worker [phone]
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
## 2017-05-20 ENCOUNTER — Emergency Department (HOSPITAL_COMMUNITY): Payer: Medicaid Other

## 2017-05-20 ENCOUNTER — Emergency Department (HOSPITAL_COMMUNITY)
Admission: EM | Admit: 2017-05-20 | Discharge: 2017-05-20 | Disposition: A | Discharge status: Home / Self Care | Payer: Medicaid Other | Attending: Physician Assistant | Admitting: Physician Assistant

## 2017-05-20 ENCOUNTER — Other Ambulatory Visit: Payer: Self-pay

## 2017-05-20 DIAGNOSIS — R05 Cough: Secondary | ICD-10-CM | POA: Diagnosis present

## 2017-05-20 DIAGNOSIS — J069 Acute upper respiratory infection, unspecified: Secondary | ICD-10-CM | POA: Insufficient documentation

## 2017-05-20 DIAGNOSIS — Z79899 Other long term (current) drug therapy: Secondary | ICD-10-CM | POA: Insufficient documentation

## 2017-05-20 LAB — I-STAT CHEM 8, ED
BUN: 22 mg/dL — AB (ref 6–20)
CHLORIDE: 99 mmol/L — AB (ref 101–111)
CREATININE: 1.1 mg/dL (ref 0.61–1.24)
Calcium, Ion: 1.24 mmol/L (ref 1.15–1.40)
GLUCOSE: 77 mg/dL (ref 65–99)
HEMATOCRIT: 43 % (ref 39.0–52.0)
HEMOGLOBIN: 14.6 g/dL (ref 13.0–17.0)
POTASSIUM: 4.9 mmol/L (ref 3.5–5.1)
Sodium: 138 mmol/L (ref 135–145)
TCO2: 30 mmol/L (ref 22–32)

## 2017-05-20 LAB — CBC
HEMATOCRIT: 38.5 % — AB (ref 39.0–52.0)
HEMOGLOBIN: 13 g/dL (ref 13.0–17.0)
MCH: 27.4 pg (ref 26.0–34.0)
MCHC: 33.8 g/dL (ref 30.0–36.0)
MCV: 81.1 fL (ref 78.0–100.0)
Platelets: 201 10*3/uL (ref 150–400)
RBC: 4.75 MIL/uL (ref 4.22–5.81)
RDW: 13.6 % (ref 11.5–15.5)
WBC: 6.6 10*3/uL (ref 4.0–10.5)

## 2017-05-20 LAB — CBG MONITORING, ED: Glucose-Capillary: 91 mg/dL (ref 65–99)

## 2017-05-20 MED ORDER — GUAIFENESIN 100 MG/5ML PO LIQD: 100.0000 mg | ORAL | 0 refills | Status: AC | PRN

## 2017-05-20 NOTE — ED Triage Notes (Signed)
Patient with hx of autism came from urgent care with c/o AMS, Hypertension, and Abd pain x 1 day.

## 2017-05-20 NOTE — ED Provider Notes (Signed)
[facility] Note   CSN: [id number] Arrival date & time: 05/20/17  1144     History   Chief Complaint Chief Complaint  Patient presents with  . Altered Mental Status  . Abdominal Pain    HPI Raymond Rosario is a 22 y.o. male.  Who lives in a group home who has a past medical history of mental retardation.  He is on multiple medications.  Patient has had several days of nasal congestion and cough.  He has not been given any medications as he needs in order from a primary care physician.  This morning his caretaker took him to the doctor and was found to be hypertensive and was sent to the emergency department.  He had no complaint of abdominal pain and was not having any altered mental status according to his caretaker who is by the bedside.  She states that he is lethargic which is normal for him after taking his medications in the morning and that generally around noon he starts to perk up.  He has not been running fevers he has been eating and drinking normally and acting himself otherwise.  HPI  Past Medical History:  Diagnosis Date  . ADHD (attention deficit hyperactivity disorder)   . Mental retardation     Patient Active Problem List   Diagnosis Date Noted  . ADHD (attention deficit hyperactivity disorder), combined type 01/17/2016  . Intellectual disability 01/17/2016  . Intermittent explosive disorder 01/17/2016    No past surgical history on file.     Home Medications    Prior to Admission medications   Medication Sig Start Date End Date Taking? Authorizing Provider  [employer] WITH PUMP gel Apply 1 application topically daily. Use to wash problem acne areas once daily. 12/16/15   [provider]  cetirizine (ZYRTEC) 10 MG tablet Take 10 mg by mouth every morning.    [provider]  chlorhexidine (PERIDEX) 0.12 % solution Fill cap TO fill line (15 ML) AND SWISH in MOUTH FOR 30 seconds THEN spit OUT  AFTER breakfast AND AT BEDTIME. 12/19/15   [provider]  chlorproMAZINE (THORAZINE) 200 MG tablet Take 200-400 mg by mouth 2 (two) times daily. Take 200 mg every morning and 400 mg every night at bedtime. 12/16/15   [provider]  Cholecalciferol (VITAMIN D3) 2000 units TABS Take 2,000 Units by mouth daily.    [provider]  desmopressin (DDAVP) 0.2 MG tablet Take 0.2-0.4 mg by mouth 2 (two) times daily. Take 1 tablet every morning and 2 tablets every night    [provider]  diphenhydrAMINE (BENADRYL) 25 mg capsule Take 50 mg by mouth at bedtime.    [provider]  divalproex (DEPAKOTE ER) 500 MG 24 hr tablet Take 500-1,000 mg by mouth See admin instructions. Take 500 mg every morning and 1000 mg every evening at bedtime. 12/16/15   [provider]  estradiol (ESTRACE) 0.5 MG tablet Take 0.5 mg by mouth daily.    [provider]  estradiol (ESTRACE) 1 MG tablet Take 1 mg by mouth daily. 12/07/16   [provider]  fesoterodine (TOVIAZ) 8 MG TB24 tablet Take 8 mg by mouth daily.    [provider]  FLUoxetine (PROZAC) 20 MG capsule Take 40 mg by mouth every morning.  12/16/15   [provider]  fluticasone (FLONASE) 50 MCG/ACT nasal spray Place 1 spray into both nostrils daily. 12/07/16   [provider]  hydrochlorothiazide (  HYDRODIURIL) 12.5 MG tablet Take 12.5 mg by mouth daily. 12/07/16   [provider]  hydrOXYzine (ATARAX/VISTARIL) 25 MG tablet Take 1 tablet (25 mg total) by mouth every 12 (twelve) hours as needed (agitation). Patient not taking: Reported on 12/16/2016 03/22/16   Charm Rings, NP  imipramine (TOFRANIL) 25 MG tablet Take 25 mg by mouth at bedtime. 12/07/16   [provider]  LORazepam (ATIVAN) 1 MG tablet Take 0.5 mg by mouth at bedtime. 11/05/16   [provider]  LORazepam (ATIVAN) 2 MG tablet Take 2 mg by mouth every 6 (six) hours as needed for  agitation. 10/07/16   [provider]  methylphenidate (CONCERTA) 54 MG CR tablet Take 1 tablet (54 mg total) by mouth every morning. Patient not taking: Reported on 01/20/2016 12/07/12   Muthersbaugh, Dahlia Client, PA-C  methylphenidate (CONCERTA) 54 MG CR tablet Take 1 tablet (54 mg total) by mouth every morning. Patient not taking: Reported on 01/20/2016 01/29/13   Jaynie Crumble, PA-C  metoprolol tartrate (LOPRESSOR) 50 MG tablet Take 50 mg by mouth daily. 12/07/16   [provider]  sulindac (CLINORIL) 200 MG tablet Take 200 mg by mouth 2 (two) times daily. 10/21/16   [provider]  traZODone (DESYREL) 100 MG tablet Take 100 mg by mouth at bedtime as needed for sleep.  12/16/15   [provider]  VESICARE 10 MG tablet Take 10 mg by mouth daily. 12/16/15   [provider]    Family History No family history on file.  Social History Social History   Tobacco Use  . Smoking status: Never Smoker  . Smokeless tobacco: Never Used  Substance Use Topics  . Alcohol use: No  . Drug use: No     Allergies   Patient has no known allergies.   Review of Systems Review of Systems  Unable to perform ROS: Other (Mental retardation)     Physical Exam Updated Vital Signs Pulse 82   Resp 15   SpO2 99%   Physical Exam  Constitutional: He appears well-developed and well-nourished. No distress.  HENT:  Head: Normocephalic and atraumatic.  Nose: Mucosal edema present.  With facial edema, nasal congestion, mouth breathing  Eyes: Conjunctivae are normal. No scleral icterus.  Neck: Normal range of motion. Neck supple.  Cardiovascular: Normal rate, regular rhythm and normal heart sounds.  Pulmonary/Chest: Effort normal and breath sounds normal. No respiratory distress.  Abdominal: Soft. There is no tenderness.  Musculoskeletal: He exhibits no edema.  Neurological: He is alert.  Skin: Skin is warm and dry. He is not diaphoretic.  Psychiatric: His  behavior is normal.  Nursing note and vitals reviewed.    ED Treatments / Results  Labs (all labs ordered are listed, but only abnormal results are displayed) Labs Reviewed  CBC - Abnormal; Notable for the following components:      Result Value   HCT 38.5 (*)    All other components within normal limits  I-STAT CHEM 8, ED - Abnormal; Notable for the following components:   Chloride 99 (*)    BUN 22 (*)    All other components within normal limits  COMPREHENSIVE METABOLIC PANEL  CBG MONITORING, ED    EKG  EKG Interpretation None       Radiology No results found.  Procedures Procedures (including critical care time)  Medications Ordered in ED Medications - No data to display   Initial Impression / Assessment and Plan / ED Course  I have reviewed  the triage vital signs and the nursing notes.  Pertinent labs & imaging results that were available during my care of the patient were reviewed by me and considered in my medical decision making (see chart for details).     Vitals:   05/20/17 1415 05/20/17 1430 05/20/17 1445 05/20/17 1500  BP: 116/79 128/83 123/90 130/79  Pulse: 80 82 87 85  Resp: 16 17 16 16   SpO2: 100% 99% 97% 100%  Patient vital signs stable throughout his visit, had 1 hypertensive reading.  This is likely an outlier.  Patient appears ill.  His chest x-ray is reviewed without significant abnormalities.  I have reviewed the patient's labs which are also without significant abnormality.  Patient will be discharged with Robitussin cough medicine.  Follow-up with PCP.  He appears appropriate for discharge at this time   Final Clinical Impressions(s) / ED Diagnoses   Final diagnoses:  None    ED Discharge Orders    None       Arthor Captain, PA-C 05/20/17 1608    Abelino Derrick, MD 05/20/17 1943

## 2017-05-20 NOTE — Discharge Instructions (Signed)
You appear to have an upper respiratory infection (URI). An upper respiratory tract infection, or cold, is a viral infection of the air passages leading to the lungs. It is contagious and can be spread to others, especially during the first 3 or 4 days. It cannot be cured by antibiotics or other medicines. °RETURN IMMEDIATELY IF you develop shortness of breath, confusion or altered mental status, a new rash, become dizzy, faint, or poorly responsive, or are unable to be cared for at home. ° °

## 2017-09-02 ENCOUNTER — Encounter (HOSPITAL_COMMUNITY): Payer: Self-pay | Admitting: Emergency Medicine

## 2017-09-02 ENCOUNTER — Ambulatory Visit (HOSPITAL_COMMUNITY)
Admission: EM | Admit: 2017-09-02 | Discharge: 2017-09-02 | Disposition: A | Discharge status: Home / Self Care | Payer: Medicaid Other | Attending: Family Medicine | Admitting: Family Medicine

## 2017-09-02 DIAGNOSIS — F6381 Intermittent explosive disorder: Secondary | ICD-10-CM

## 2017-09-02 DIAGNOSIS — F79 Unspecified intellectual disabilities: Secondary | ICD-10-CM | POA: Diagnosis not present

## 2017-09-02 DIAGNOSIS — F909 Attention-deficit hyperactivity disorder, unspecified type: Secondary | ICD-10-CM

## 2017-09-02 DIAGNOSIS — Z760 Encounter for issue of repeat prescription: Secondary | ICD-10-CM

## 2017-09-02 MED ORDER — DESMOPRESSIN ACETATE 0.2 MG PO TABS: 0.2000 mg | ORAL_TABLET | Freq: Two times a day (BID) | ORAL | 0 refills | Status: AC

## 2017-09-02 MED ORDER — CLONAZEPAM 0.5 MG PO TABS: 0.5000 mg | ORAL_TABLET | Freq: Two times a day (BID) | ORAL | 0 refills | Status: AC

## 2017-09-02 NOTE — ED Provider Notes (Signed)
[facility]    CSN: [id number] Arrival date & time: 09/02/17  1712     History   Chief Complaint Chief Complaint  Patient presents with  . Medication Refill    HPI Raymond Rosario is a 22 y.o. male.   HPI  Raymond Rosario is a mentally impaired individual who lives in a group home.  He is on multiple medications.  He is stable.  Because of a change in their primary care doctors, he is going to run out of 2 of his medications before his appointment next week with a new doctor.  He is here with 1 of the caregivers from the group home requesting a 1 week supply of medication to last until that appointment.  No recent behaviors.  No recent fall or illness.  No recent hospitalization.  Past Medical History:  Diagnosis Date  . ADHD (attention deficit hyperactivity disorder)   . Mental retardation     Patient Active Problem List   Diagnosis Date Noted  . ADHD (attention deficit hyperactivity disorder), combined type 01/17/2016  . Intellectual disability 01/17/2016  . Intermittent explosive disorder 01/17/2016    History reviewed. No pertinent surgical history.     Home Medications    Prior to Admission medications   Medication Sig Start Date End Date Taking? Authorizing Provider  [employer] WITH PUMP gel Apply 1 application topically daily. Use to wash problem acne areas once daily. 12/16/15   [provider]  cetirizine (ZYRTEC) 10 MG tablet Take 10 mg by mouth every morning.    [provider]  chlorhexidine (PERIDEX) 0.12 % solution Fill cap TO fill line (15 ML) AND SWISH in MOUTH FOR 30 seconds THEN spit OUT AFTER breakfast AND AT BEDTIME. 12/19/15   [provider]  chlorproMAZINE (THORAZINE) 200 MG tablet Take 200-400 mg by mouth 2 (two) times daily. Take 200 mg every morning and 400 mg every night at bedtime. 12/16/15   [provider]  Cholecalciferol (VITAMIN D3) 2000 units TABS Take 2,000 Units by mouth daily.    [provider]  clonazePAM (KLONOPIN) 0.5 MG tablet Take 1 tablet (0.5 mg total) by mouth 2 (two) times daily. 09/02/17   Eustace MooreNelson, Yvonne Sue, MD  desmopressin (DDAVP) 0.2 MG tablet Take 1-2 tablets (0.2-0.4 mg total) by mouth 2 (two) times daily. Take 1 tablet every morning and 2 tablets every night 09/02/17   Eustace MooreNelson, Yvonne Sue, MD  diphenhydrAMINE (BENADRYL) 25 mg capsule Take 50 mg by mouth at bedtime.    [provider]  divalproex (DEPAKOTE ER) 500 MG 24 hr tablet Take 500-1,000 mg by mouth See admin instructions. Take 500 mg every morning and 1000 mg every evening at bedtime. 12/16/15   [provider]  estradiol (ESTRACE) 0.5 MG tablet Take 0.5 mg by mouth daily.    [provider]  estradiol (ESTRACE) 1 MG tablet Take 1 mg by mouth daily. 12/07/16   [provider]  fesoterodine (TOVIAZ) 8 MG TB24 tablet Take 8 mg by mouth daily.    [provider]  FLUoxetine (PROZAC) 20 MG capsule Take 40 mg by mouth every morning.  12/16/15   [provider]  fluticasone (FLONASE) 50 MCG/ACT nasal spray Place 1 spray into both nostrils daily. 12/07/16   [provider]  guaiFENesin (ROBITUSSIN) 100 MG/5ML liquid Take 5-10 mLs (100-200 mg total) by mouth every 4 (four) hours as needed for cough. 05/20/17   Arthor CaptainHarris, Abigail, PA-C  hydrochlorothiazide (HYDRODIURIL) 12.5 MG tablet Take  12.5 mg by mouth daily. 12/07/16   [provider]  imipramine (TOFRANIL) 25 MG tablet Take 25 mg by mouth at bedtime. 12/07/16   [provider]  LORazepam (ATIVAN) 1 MG tablet Take 0.5 mg by mouth at bedtime. 11/05/16   [provider]  LORazepam (ATIVAN) 2 MG tablet Take 2 mg by mouth every 6 (six) hours as needed for agitation. 10/07/16   [provider]  metoprolol tartrate (LOPRESSOR) 50 MG tablet Take 50 mg by mouth daily. 12/07/16   [provider]  sulindac (CLINORIL) 200 MG tablet Take 200 mg by mouth 2 (two) times daily.  10/21/16   [provider]  traZODone (DESYREL) 100 MG tablet Take 100 mg by mouth at bedtime as needed for sleep.  12/16/15   [provider]  VESICARE 10 MG tablet Take 10 mg by mouth daily. 12/16/15   [provider]    Family History History reviewed. No pertinent family history.  Social History Social History   Tobacco Use  . Smoking status: Never Smoker  . Smokeless tobacco: Never Used  Substance Use Topics  . Alcohol use: No  . Drug use: No     Allergies   Patient has no known allergies.   Review of Systems Review of Systems  Constitutional: Negative for chills and fever.  HENT: Negative for ear pain and sore throat.   Eyes: Negative for pain and visual disturbance.  Respiratory: Negative for cough and shortness of breath.   Cardiovascular: Negative for chest pain and palpitations.  Gastrointestinal: Negative for abdominal pain and vomiting.  Genitourinary: Negative for dysuria and hematuria.  Musculoskeletal: Negative for arthralgias and back pain.  Skin: Negative for color change and rash.  Neurological: Negative for seizures and syncope.  Psychiatric/Behavioral: Negative for agitation and behavioral problems.       No problems recently.  Stable on medicines  All other systems reviewed and are negative.    Physical Exam Triage Vital Signs ED Triage Vitals [09/02/17 1724]  Enc Vitals Group     BP (!) 142/98     Pulse Rate 92     Resp 18     Temp 98 F (36.7 C)     Temp Source Oral     SpO2 100 %   No data found.  Updated Vital Signs BP (!) 142/98 (BP Location: Left Arm)   Pulse 92   Temp 98 F (36.7 C) (Oral)   Resp 18   SpO2 100%       Physical Exam  Constitutional: He appears well-developed and well-nourished. No distress.  Overweight.  Slightly somnolent.  HENT:  Head: Normocephalic and atraumatic.  Mouth/Throat: Oropharynx is clear and moist.  Eyes: Pupils are equal, round, and reactive to light. Conjunctivae  are normal.  Neck: Normal range of motion.  Cardiovascular: Normal rate.  Pulmonary/Chest: Effort normal. No respiratory distress.  Abdominal: Soft. He exhibits no distension.  Musculoskeletal: Normal range of motion. He exhibits no edema.  Skin: Skin is warm and dry.  Psychiatric: His behavior is normal.     UC Treatments / Results  Labs (all labs ordered are listed, but only abnormal results are displayed) Labs Reviewed - No data to display  EKG None  Radiology No results found.  Procedures Procedures (including critical care time)  Medications Ordered in UC Medications - No data to display  Initial Impression / Assessment and Plan / UC Course  I have reviewed the triage vital signs and the nursing  notes.  Pertinent labs & imaging results that were available during my care of the patient were reviewed by me and considered in my medical decision making (see chart for details).      Final Clinical Impressions(s) / UC Diagnoses   Final diagnoses:  Encounter for medication refill     Discharge Instructions     Medicine refills sent to the pharmacy   ED Prescriptions    Medication Sig Dispense Auth. Provider   clonazePAM (KLONOPIN) 0.5 MG tablet Take 1 tablet (0.5 mg total) by mouth 2 (two) times daily. 12 tablet Eustace Moore, MD   desmopressin (DDAVP) 0.2 MG tablet Take 1-2 tablets (0.2-0.4 mg total) by mouth 2 (two) times daily. Take 1 tablet every morning and 2 tablets every night 21 tablet Eustace Moore, MD     Controlled Substance Prescriptions [place] Controlled Substance Registry consulted? Yes, I have consulted the [place] Controlled Substances Registry for this patient, and feel the risk/benefit ratio today is favorable for proceeding with this prescription for a controlled substance.  Last refill of clonazepam was 08/03/2017 with instructions to take twice a day.  He will run out tomorrow.   Eustace Moore, MD 09/02/17 [phone]

## 2017-09-02 NOTE — ED Triage Notes (Signed)
Pt here from group home for refill on meds

## 2017-09-02 NOTE — Discharge Instructions (Addendum)
Medicine refills sent to the pharmacy

## 2017-09-16 ENCOUNTER — Emergency Department (HOSPITAL_COMMUNITY)
Admission: EM | Admit: 2017-09-16 | Discharge: 2017-09-16 | Disposition: A | Discharge status: Home / Self Care | Payer: Medicaid Other | Attending: Emergency Medicine | Admitting: Emergency Medicine

## 2017-09-16 ENCOUNTER — Encounter (HOSPITAL_COMMUNITY): Payer: Self-pay | Admitting: Emergency Medicine

## 2017-09-16 ENCOUNTER — Emergency Department (HOSPITAL_COMMUNITY): Payer: Medicaid Other

## 2017-09-16 DIAGNOSIS — F70 Mild intellectual disabilities: Secondary | ICD-10-CM | POA: Diagnosis not present

## 2017-09-16 DIAGNOSIS — F329 Major depressive disorder, single episode, unspecified: Secondary | ICD-10-CM | POA: Diagnosis not present

## 2017-09-16 DIAGNOSIS — J029 Acute pharyngitis, unspecified: Secondary | ICD-10-CM | POA: Insufficient documentation

## 2017-09-16 DIAGNOSIS — Z79899 Other long term (current) drug therapy: Secondary | ICD-10-CM | POA: Insufficient documentation

## 2017-09-16 DIAGNOSIS — R451 Restlessness and agitation: Secondary | ICD-10-CM | POA: Diagnosis not present

## 2017-09-16 DIAGNOSIS — R634 Abnormal weight loss: Secondary | ICD-10-CM | POA: Insufficient documentation

## 2017-09-16 DIAGNOSIS — F32A Depression, unspecified: Secondary | ICD-10-CM

## 2017-09-16 LAB — COMPREHENSIVE METABOLIC PANEL
ALK PHOS: 50 U/L (ref 38–126)
ALT: 25 U/L (ref 0–44)
ANION GAP: 9 (ref 5–15)
AST: 23 U/L (ref 15–41)
Albumin: 3.7 g/dL (ref 3.5–5.0)
BUN: 21 mg/dL — ABNORMAL HIGH (ref 6–20)
CALCIUM: 9.4 mg/dL (ref 8.9–10.3)
CO2: 26 mmol/L (ref 22–32)
Chloride: 103 mmol/L (ref 98–111)
Creatinine, Ser: 0.91 mg/dL (ref 0.61–1.24)
Glucose, Bld: 88 mg/dL (ref 70–99)
Potassium: 3.4 mmol/L — ABNORMAL LOW (ref 3.5–5.1)
SODIUM: 138 mmol/L (ref 135–145)
Total Bilirubin: 0.9 mg/dL (ref 0.3–1.2)
Total Protein: 6.9 g/dL (ref 6.5–8.1)

## 2017-09-16 LAB — CBC WITH DIFFERENTIAL/PLATELET
ABS IMMATURE GRANULOCYTES: 0 10*3/uL (ref 0.0–0.1)
BASOS PCT: 0 %
Basophils Absolute: 0 10*3/uL (ref 0.0–0.1)
Eosinophils Absolute: 0.1 10*3/uL (ref 0.0–0.7)
Eosinophils Relative: 1 %
HCT: 41.4 % (ref 39.0–52.0)
Hemoglobin: 13.8 g/dL (ref 13.0–17.0)
IMMATURE GRANULOCYTES: 0 %
Lymphocytes Relative: 47 %
Lymphs Abs: 3 10*3/uL (ref 0.7–4.0)
MCH: 26.8 pg (ref 26.0–34.0)
MCHC: 33.3 g/dL (ref 30.0–36.0)
MCV: 80.4 fL (ref 78.0–100.0)
MONOS PCT: 8 %
Monocytes Absolute: 0.5 10*3/uL (ref 0.1–1.0)
NEUTROS PCT: 44 %
Neutro Abs: 2.8 10*3/uL (ref 1.7–7.7)
PLATELETS: 203 10*3/uL (ref 150–400)
RBC: 5.15 MIL/uL (ref 4.22–5.81)
RDW: 13.6 % (ref 11.5–15.5)
WBC: 6.3 10*3/uL (ref 4.0–10.5)

## 2017-09-16 LAB — ETHANOL

## 2017-09-16 LAB — GROUP A STREP BY PCR: Group A Strep by PCR: NOT DETECTED

## 2017-09-16 LAB — VALPROIC ACID LEVEL: VALPROIC ACID LVL: 107 ug/mL — AB (ref 50.0–100.0)

## 2017-09-16 LAB — MONONUCLEOSIS SCREEN: MONO SCREEN: NEGATIVE

## 2017-09-16 MED ORDER — TRAZODONE HCL 100 MG PO TABS: 100.0000 mg | ORAL_TABLET | Freq: Every evening | ORAL | Status: DC | PRN

## 2017-09-16 MED ORDER — ESTRADIOL 1 MG PO TABS: 0.5000 mg | ORAL_TABLET | Freq: Every day | ORAL | Status: DC

## 2017-09-16 MED ORDER — HYDROCHLOROTHIAZIDE 25 MG PO TABS: 12.5000 mg | ORAL_TABLET | Freq: Every day | ORAL | Status: DC

## 2017-09-16 MED ORDER — FLUOXETINE HCL 20 MG PO CAPS: 40.0000 mg | ORAL_CAPSULE | Freq: Every morning | ORAL | Status: DC

## 2017-09-16 MED ORDER — GUAIFENESIN 100 MG/5ML PO LIQD: 100.0000 mg | ORAL | Status: DC | PRN

## 2017-09-16 MED ORDER — DIPHENHYDRAMINE HCL 25 MG PO CAPS: 50.0000 mg | ORAL_CAPSULE | Freq: Every day | ORAL | Status: DC

## 2017-09-16 MED ORDER — FLUTICASONE PROPIONATE 50 MCG/ACT NA SUSP: 1.0000 | Freq: Every day | NASAL | Status: DC

## 2017-09-16 MED ORDER — LORATADINE 10 MG PO TABS: 10.0000 mg | ORAL_TABLET | Freq: Every day | ORAL | Status: DC

## 2017-09-16 MED ORDER — METOPROLOL TARTRATE 25 MG PO TABS: 50.0000 mg | ORAL_TABLET | Freq: Every day | ORAL | Status: DC

## 2017-09-16 MED ORDER — CHLORPROMAZINE HCL 25 MG PO TABS: 200.0000 mg | ORAL_TABLET | Freq: Two times a day (BID) | ORAL | Status: DC

## 2017-09-16 MED ORDER — FESOTERODINE FUMARATE ER 8 MG PO TB24: 8.0000 mg | ORAL_TABLET | Freq: Every day | ORAL | Status: DC

## 2017-09-16 MED ORDER — IOHEXOL 300 MG/ML  SOLN
75.0000 mL | Freq: Once | INTRAMUSCULAR | Status: AC | PRN
  Administered 2017-09-16: 75 mL via INTRAVENOUS

## 2017-09-16 MED ORDER — CLONAZEPAM 0.5 MG PO TABS: 0.5000 mg | ORAL_TABLET | Freq: Two times a day (BID) | ORAL | Status: DC

## 2017-09-16 MED ORDER — IMIPRAMINE HCL 25 MG PO TABS: 25.0000 mg | ORAL_TABLET | Freq: Every day | ORAL | Status: DC

## 2017-09-16 MED ORDER — ESTRADIOL 1 MG PO TABS: 1.0000 mg | ORAL_TABLET | Freq: Every day | ORAL | Status: DC

## 2017-09-16 MED ORDER — DARIFENACIN HYDROBROMIDE ER 15 MG PO TB24: 15.0000 mg | ORAL_TABLET | Freq: Every day | ORAL | Status: DC

## 2017-09-16 MED ORDER — SODIUM CHLORIDE 0.9 % IV BOLUS
1000.0000 mL | Freq: Once | INTRAVENOUS | Status: AC
Start: 2017-09-16 — End: 2017-09-16
  Administered 2017-09-16: 1000 mL via INTRAVENOUS

## 2017-09-16 MED ORDER — LORAZEPAM 1 MG PO TABS: 2.0000 mg | ORAL_TABLET | Freq: Four times a day (QID) | ORAL | Status: DC | PRN

## 2017-09-16 MED ORDER — DESMOPRESSIN ACETATE 0.2 MG PO TABS: 0.2000 mg | ORAL_TABLET | Freq: Two times a day (BID) | ORAL | Status: DC

## 2017-09-16 MED ORDER — LORAZEPAM 0.5 MG PO TABS: 0.5000 mg | ORAL_TABLET | Freq: Every day | ORAL | Status: DC

## 2017-09-16 MED ORDER — VITAMIN D3 50 MCG (2000 UT) PO TABS: 2000.0000 [IU] | ORAL_TABLET | Freq: Every day | ORAL | Status: DC

## 2017-09-16 MED ORDER — SULINDAC 200 MG PO TABS: 200.0000 mg | ORAL_TABLET | Freq: Two times a day (BID) | ORAL | Status: DC

## 2017-09-16 MED ORDER — DIVALPROEX SODIUM ER 500 MG PO TB24
500.0000 mg | ORAL_TABLET | ORAL | Status: DC
Start: 2017-09-16 — End: 2017-09-16

## 2017-09-16 NOTE — ED Notes (Signed)
Per edp verbal order to d/c all med orders

## 2017-09-16 NOTE — ED Notes (Signed)
RN aware bolus is still running.

## 2017-09-16 NOTE — ED Triage Notes (Signed)
Pt to ER brought in by caregiver from group home - who provides all the hx as patient is baseline nonverbal - caregiver reports 1 day hx of patient complaining of sore throat. She also reports "a few days" of "odd behavior" and reports 7 lbs of weight loss since Friday. Reports no appetite. Pt is following commands. In NAD. VSS. Also reports hasn't been taking his medications appropriately.

## 2017-09-16 NOTE — ED Notes (Signed)
Called CT to inform of new access and that pt is ready for transport

## 2017-09-16 NOTE — ED Provider Notes (Addendum)
[facility] EMERGENCY DEPARTMENT Provider Note   CSN: [id number] Arrival date & time: 09/16/17  1019     History   Chief Complaint Chief Complaint  Patient presents with  . Weight Loss  . Sore Throat    HPI Raymond Rosario is a 22 y.o. male.  Pt presents to the ED from a group home.  He has severe MR and is nonverbal at baseline.  The caregiver gives the hx.  She is not sure what has been going on with the patient, but he's lost weight over the last week.  She said 1 of the owners of the group home has recently passed away.  The pt was very close to that person and he was like a father.  The caregiver thinks he may be very depressed.  He has not been eating.  He has not been wanting to take his medicines.  He has been more agitated than normal (hx intermittent explosive d/o).  He indicated today that his throat has been hurting.  He has not had a fever.  The caregiver wants his throat checked and if that is ok, to consult with TTS.     Past Medical History:  Diagnosis Date  . ADHD (attention deficit hyperactivity disorder)   . Mental retardation     Patient Active Problem List   Diagnosis Date Noted  . ADHD (attention deficit hyperactivity disorder), combined type 01/17/2016  . Intellectual disability 01/17/2016  . Intermittent explosive disorder 01/17/2016    History reviewed. No pertinent surgical history.      Home Medications    Prior to Admission medications   Medication Sig Start Date End Date Taking? Authorizing Provider  [employer] WITH PUMP gel Apply 1 application topically daily. Use to wash problem acne areas once daily. 12/16/15   [provider]  cetirizine (ZYRTEC) 10 MG tablet Take 10 mg by mouth every morning.    [provider]  chlorhexidine (PERIDEX) 0.12 % solution Fill cap TO fill line (15 ML) AND SWISH in MOUTH FOR 30 seconds THEN spit OUT AFTER breakfast AND AT BEDTIME. 12/19/15   [provider]    chlorproMAZINE (THORAZINE) 200 MG tablet Take 200-400 mg by mouth 2 (two) times daily. Take 200 mg every morning and 400 mg every night at bedtime. 12/16/15   [provider]  Cholecalciferol (VITAMIN D3) 2000 units TABS Take 2,000 Units by mouth daily.    [provider]  clonazePAM (KLONOPIN) 0.5 MG tablet Take 1 tablet (0.5 mg total) by mouth 2 (two) times daily. 09/02/17   Eustace Moore, MD  desmopressin (DDAVP) 0.2 MG tablet Take 1-2 tablets (0.2-0.4 mg total) by mouth 2 (two) times daily. Take 1 tablet every morning and 2 tablets every night 09/02/17   Eustace Moore, MD  diphenhydrAMINE (BENADRYL) 25 mg capsule Take 50 mg by mouth at bedtime.    [provider]  divalproex (DEPAKOTE ER) 500 MG 24 hr tablet Take 500-1,000 mg by mouth See admin instructions. Take 500 mg every morning and 1000 mg every evening at bedtime. 12/16/15   [provider]  estradiol (ESTRACE) 0.5 MG tablet Take 0.5 mg by mouth daily.    [provider]  estradiol (ESTRACE) 1 MG tablet Take 1 mg by mouth daily. 12/07/16   [provider]  fesoterodine (TOVIAZ) 8 MG TB24 tablet Take 8 mg by mouth daily.    [provider]  FLUoxetine (PROZAC) 20 MG capsule Take 40 mg by  mouth every morning.  12/16/15   [provider]  fluticasone (FLONASE) 50 MCG/ACT nasal spray Place 1 spray into both nostrils daily. 12/07/16   [provider]  guaiFENesin (ROBITUSSIN) 100 MG/5ML liquid Take 5-10 mLs (100-200 mg total) by mouth every 4 (four) hours as needed for cough. 05/20/17   Arthor Captain, PA-C  hydrochlorothiazide (HYDRODIURIL) 12.5 MG tablet Take 12.5 mg by mouth daily. 12/07/16   [provider]  imipramine (TOFRANIL) 25 MG tablet Take 25 mg by mouth at bedtime. 12/07/16   [provider]  LORazepam (ATIVAN) 1 MG tablet Take 0.5 mg by mouth at bedtime. 11/05/16   [provider]  LORazepam (ATIVAN) 2 MG tablet Take 2 mg by  mouth every 6 (six) hours as needed for agitation. 10/07/16   [provider]  metoprolol tartrate (LOPRESSOR) 50 MG tablet Take 50 mg by mouth daily. 12/07/16   [provider]  sulindac (CLINORIL) 200 MG tablet Take 200 mg by mouth 2 (two) times daily. 10/21/16   [provider]  traZODone (DESYREL) 100 MG tablet Take 100 mg by mouth at bedtime as needed for sleep.  12/16/15   [provider]  VESICARE 10 MG tablet Take 10 mg by mouth daily. 12/16/15   [provider]    Family History History reviewed. No pertinent family history.  Social History Social History   Tobacco Use  . Smoking status: Never Smoker  . Smokeless tobacco: Never Used  Substance Use Topics  . Alcohol use: No  . Drug use: No     Allergies   Patient has no known allergies.   Review of Systems Review of Systems  Constitutional: Positive for appetite change and unexpected weight change.  HENT: Positive for sore throat.   Psychiatric/Behavioral: Positive for agitation and behavioral problems.  All other systems reviewed and are negative.    Physical Exam Updated Vital Signs BP (!) 128/99   Pulse 69   Temp 98.5 F (36.9 C) (Oral)   Resp 18   SpO2 99%   Physical Exam  Constitutional: He appears well-developed and well-nourished.  HENT:  Head: Normocephalic and atraumatic.  Right Ear: Hearing, tympanic membrane and ear canal normal.  Left Ear: Hearing, tympanic membrane and ear canal normal.  Mouth/Throat: Mucous membranes are normal.  Pt is drooling  Eyes: Pupils are equal, round, and reactive to light.  Neck: Normal range of motion.  Cardiovascular: Normal rate, regular rhythm, normal heart sounds and intact distal pulses.  Pulmonary/Chest: Effort normal and breath sounds normal.  Abdominal: Soft. Bowel sounds are normal.  Neurological: He is alert.  Unable to assess orientation as pt is nonverbal  Skin: Skin is warm. Capillary refill takes less than 2  seconds.  Psychiatric:  Pt is nonverbal, but does not appear anxious.  He is calm in the room.  Nursing note and vitals reviewed.    ED Treatments / Results  Labs (all labs ordered are listed, but only abnormal results are displayed) Labs Reviewed  COMPREHENSIVE METABOLIC PANEL - Abnormal; Notable for the following components:      Result Value   Potassium 3.4 (*)    BUN 21 (*)    All other components within normal limits  VALPROIC ACID LEVEL - Abnormal; Notable for the following components:   Valproic Acid Lvl 107 (*)    All other components within normal limits  GROUP A STREP BY PCR  CBC WITH DIFFERENTIAL/PLATELET  MONONUCLEOSIS SCREEN  ETHANOL  URINALYSIS, ROUTINE W  REFLEX MICROSCOPIC  RAPID URINE DRUG SCREEN, HOSP PERFORMED    EKG None  Radiology Dg Chest 2 View  Result Date: 09/16/2017 CLINICAL DATA:  sore throat. She also reports "a few days" of "odd behavior" and reports 7 lbs of weight loss since Friday. Reports no appetite EXAM: CHEST - 2 VIEW COMPARISON:  05/20/2017 FINDINGS: Relatively low lung volumes as before. Streaky left infrahilar opacities probably some subsegmental atelectasis, stable. Heart size upper limits normal for technique. No effusion. Visualized bones unremarkable. IMPRESSION: Low volumes.  No acute cardiopulmonary disease. Electronically Signed   By: Corlis Leak M.D.   On: 09/16/2017 11:30   Ct Soft Tissue Neck W Contrast  Result Date: 09/16/2017 CLINICAL DATA:  Sore throat.  Loss of appetite. EXAM: CT NECK WITH CONTRAST TECHNIQUE: Multidetector CT imaging of the neck was performed using the standard protocol following the bolus administration of intravenous contrast. CONTRAST:  75mL OMNIPAQUE IOHEXOL 300 MG/ML  SOLN COMPARISON:  None. FINDINGS: Pharynx and larynx: Symmetric pharyngeal soft tissues without evidence of mass or swelling. Slight symmetric prominence of the nasopharyngeal soft tissues. No fluid collection or significant inflammatory  changes in the parapharyngeal or retropharyngeal spaces. Unremarkable larynx. Salivary glands: No inflammation, mass, or stone. Thyroid: Unremarkable. Lymph nodes: At most minimally prominent level II and lateral retropharyngeal lymph nodes bilaterally, benign/reactive in appearance. Vascular: Major vascular structures of the neck are patent. Limited intracranial: Mild enlargement of the cisterna magna, a normal variant. Visualized orbits: Unremarkable. Mastoids and visualized paranasal sinuses: Clear. Skeleton: No acute osseous abnormality or suspicious osseous lesion. Upper chest: Clear lung apices. Other: None. IMPRESSION: No abscess or other acute abnormality identified in the neck. Electronically Signed   By: Sebastian Ache M.D.   On: 09/16/2017 14:48    Procedures Procedures (including critical care time)  Medications Ordered in ED Medications  loratadine (CLARITIN) tablet 10 mg (has no administration in time range)  chlorproMAZINE (THORAZINE) tablet 200-400 mg (has no administration in time range)  Vitamin D3 TABS 2,000 Units (has no administration in time range)  clonazePAM (KLONOPIN) tablet 0.5 mg (has no administration in time range)  desmopressin (DDAVP) tablet 0.2-0.4 mg (has no administration in time range)  diphenhydrAMINE (BENADRYL) capsule 50 mg (has no administration in time range)  divalproex (DEPAKOTE ER) 24 hr tablet 500-1,000 mg (has no administration in time range)  estradiol (ESTRACE) tablet 0.5 mg (has no administration in time range)  estradiol (ESTRACE) tablet 1 mg (has no administration in time range)  fesoterodine (TOVIAZ) tablet 8 mg (has no administration in time range)  FLUoxetine (PROZAC) capsule 40 mg (has no administration in time range)  fluticasone (FLONASE) 50 MCG/ACT nasal spray 1 spray (has no administration in time range)  guaiFENesin (ROBITUSSIN) 100 MG/5ML liquid 100-200 mg (has no administration in time range)  hydrochlorothiazide (HYDRODIURIL) tablet  12.5 mg (has no administration in time range)  imipramine (TOFRANIL) tablet 25 mg (has no administration in time range)  LORazepam (ATIVAN) tablet 0.5 mg (has no administration in time range)  LORazepam (ATIVAN) tablet 2 mg (has no administration in time range)  metoprolol tartrate (LOPRESSOR) tablet 50 mg (has no administration in time range)  sulindac (CLINORIL) tablet 200 mg (has no administration in time range)  traZODone (DESYREL) tablet 100 mg (has no administration in time range)  darifenacin (ENABLEX) 24 hr tablet 15 mg (has no administration in time range)  sodium chloride 0.9 % bolus 1,000 mL ( Intravenous Restarted 09/16/17 1418)  iohexol (OMNIPAQUE) 300 MG/ML solution 75 mL (  75 mLs Intravenous Contrast Given 09/16/17 1427)     Initial Impression / Assessment and Plan / ED Course  I have reviewed the triage vital signs and the nursing notes.  Pertinent labs & imaging results that were available during my care of the patient were reviewed by me and considered in my medical decision making (see chart for details).    Sore throat evaluation negative.  Pt is medically clear for TTS consult per group home request.  TTS did see pt and he is cleared from a psych perspective.  They spoke with his care giver.  Pt is stable for d/c.  Return if worse.  Final Clinical Impressions(s) / ED Diagnoses   Final diagnoses:  Weight loss  Depression, unspecified depression type    ED Discharge Orders    None       Jacalyn LefevreHaviland, Fayette [place], MD 09/16/17 [phone]    Jacalyn LefevreHaviland, Trenice [place], MD 09/16/17 [phone]

## 2017-09-16 NOTE — Progress Notes (Addendum)
Patient was seen by me today via tele-psych.  I have consulted with Dr. Jama Flavorsobos.  Patient does not meet inpatient criteria and is psychiatrically cleared.  All the information is based off of caregiver for patient, as patient is nonverbal with severe MR.   Agree with NP progress note

## 2017-09-16 NOTE — ED Notes (Signed)
Patient transported to CT 

## 2017-09-16 NOTE — ED Notes (Signed)
Med rec printed and sent to main lab for add on Valproic acid level

## 2017-09-16 NOTE — BH Assessment (Addendum)
Tele Assessment Note   Patient Name: Raymond Rosario MRN: [id number] Referring Physician: Jacalyn Lefevre Location of Patient: [facility] Location of Provider: Behavioral Health TTS Department  Raymond Rosario is a 22 y.o. male, in ED, accompanied by his group home supervisor, Raymond Rosario. Pt is severely MDD and baseline nonverbal. He's in the ED due to not wanting to eat or take his meds, losing 7 lbs in a week, and being more agitated than normal. Pt is being followed by a psychiatrist and has therapy services in place, as well. Pt's psychiatrist has been working on pt's medication regimen and pt is to start the Invega injection next week.   Pt has had a medical workup and no medical issues have been identified.   Case staffed with Raymond Calkins, NP. Pt does not meet criteria for IP treatment. He can continue to f/u with his regular MH provider. EDP [place] notified of disposition.    Diagnosis: F70 IDD, severe  Past Medical History:  Past Medical History:  Diagnosis Date  . ADHD (attention deficit hyperactivity disorder)   . Mental retardation     History reviewed. No pertinent surgical history.  Family History: History reviewed. No pertinent family history.  Social History:  reports that he has never smoked. He has never used smokeless tobacco. He reports that he does not drink alcohol or use drugs.  Additional Social History:  Alcohol / Drug Use Pain Medications: see PTA med list Prescriptions: see PTA med list Over the Counter: see PTA med list History of alcohol / drug use?: No history of alcohol / drug abuse  CIWA: CIWA-Ar BP: 119/78 Pulse Rate: 63 COWS:    Allergies: No Known Allergies  Home Medications:  (Not in a hospital admission)  OB/GYN Status:  No LMP for male patient.  General Assessment Data Location of Assessment: [facility] ED TTS Assessment: In system Is this a Tele or Face-to-Face Assessment?: Tele Assessment Is this an Initial Assessment or a Re-assessment  for this encounter?: Initial Assessment Marital status: Single Living Arrangements: Group Home([facility]) Can pt return to current living arrangement?: Yes Admission Status: Voluntary Is patient capable of signing voluntary admission?: No Referral Source: Other     Crisis Care Plan Living Arrangements: Group Home([facility]) Legal Guardian: Mother(Raymond Rosario) Name of Psychiatrist: Jovita Rosario Name of Therapist: Tourist information centre manager  Education Status Is patient currently in school?: No Is the patient employed, unemployed or receiving disability?: Receiving disability income  Risk to self with the past 6 months Suicidal Ideation: No Has patient been a risk to self within the past 6 months prior to admission? : No Suicidal Intent: No Has patient had any suicidal intent within the past 6 months prior to admission? : No Is patient at risk for suicide?: No Suicidal Plan?: No Has patient had any suicidal plan within the past 6 months prior to admission? : No Access to Means: No Previous Attempts/Gestures: No Intentional Self Injurious Behavior: None Family Suicide History: Unknown Recent stressful life event(s): Loss (Comment) Persecutory voices/beliefs?: No Depression: Yes Depression Symptoms: Feeling angry/irritable Substance abuse history and/or treatment for substance abuse?: No Suicide prevention information given to non-admitted patients: Not applicable  Risk to Others within the past 6 months Homicidal Ideation: No Does patient have any lifetime risk of violence toward others beyond the six months prior to admission? : No Thoughts of Harm to Others: No Current Homicidal Intent: No Current Homicidal Plan: No Access to Homicidal Means: No History of harm to others?: No Assessment of  Violence: None Noted Does patient have access to weapons?: No Criminal Charges Pending?: No Does patient have a court date: No Is patient on probation?:  No  Psychosis Hallucinations: None noted Delusions: None noted  Mental Status Report Appearance/Hygiene: Unremarkable Eye Contact: Good Motor Activity: Unremarkable Speech: Other (Comment)(pt is nonverbal) Level of Consciousness: Quiet/awake Mood: Euthymic Affect: Appropriate to circumstance Anxiety Level: Minimal Thought Processes: Unable to Assess Judgement: Impaired Orientation: Unable to assess Obsessive Compulsive Thoughts/Behaviors: Unable to Assess  Cognitive Functioning Concentration: Unable to Assess Memory: Unable to Assess Is patient IDD: Yes Level of Function: very low Is patient DD?: No I IQ score available?: No Insight: Unable to Assess Impulse Control: Poor Appetite: Poor Have you had any weight changes? : Loss Amount of the weight change? (lbs): 7 lbs Sleep: Unable to Assess Vegetative Symptoms: None  ADLScreening [facility] Assessment Services) Patient's cognitive ability adequate to safely complete daily activities?: No Patient able to express need for assistance with ADLs?: No Independently performs ADLs?: Yes (appropriate for developmental age)  Prior Inpatient Therapy Prior Inpatient Therapy: No  Prior Outpatient Therapy Prior Outpatient Therapy: No Does patient have an ACCT team?: No Does patient have Intensive In-House Services?  : No Does patient have [facility] services? : No Does patient have P4CC services?: No  ADL Screening (condition at time of admission) Patient's cognitive ability adequate to safely complete daily activities?: No Is the patient deaf or have difficulty hearing?: No Does the patient have difficulty seeing, even when wearing glasses/contacts?: No Does the patient have difficulty concentrating, remembering, or making decisions?: Yes Patient able to express need for assistance with ADLs?: No Independently performs ADLs?: Yes (appropriate for developmental age)  Home Assistive Devices/Equipment Home Assistive  Devices/Equipment: None    Abuse/Neglect Assessment (Assessment to be complete while patient is alone) Abuse/Neglect Assessment Can Be Completed: Unable to assess, patient is non-responsive or altered mental status       Nutrition Screen- [facility] Patient's home diet: Regular Has the patient recently lost weight without trying?: Yes, 2-13 lbs. Has the patient been eating poorly because of a decreased appetite?: Yes Malnutrition Screening Tool Score: 2        Disposition:  Disposition Initial Assessment Completed for this Encounter: Yes Patient referred to: Other (Comment)(f/u with current provider)  This service was provided via telemedicine using a 2-way, interactive audio and video technology.  Names of all persons participating in this telemedicine service and their role in this encounter.  Laddie AquasSamantha M Heru Montz 09/16/2017 4:38 PM

## 2017-09-21 ENCOUNTER — Encounter (HOSPITAL_COMMUNITY): Payer: Self-pay | Admitting: Emergency Medicine

## 2017-09-21 ENCOUNTER — Emergency Department (HOSPITAL_COMMUNITY)
Admission: EM | Admit: 2017-09-21 | Discharge: 2017-09-21 | Disposition: A | Discharge status: Home / Self Care | Payer: Medicaid Other | Attending: Emergency Medicine | Admitting: Emergency Medicine

## 2017-09-21 DIAGNOSIS — Z0489 Encounter for examination and observation for other specified reasons: Secondary | ICD-10-CM | POA: Diagnosis not present

## 2017-09-21 DIAGNOSIS — F72 Severe intellectual disabilities: Secondary | ICD-10-CM | POA: Diagnosis not present

## 2017-09-21 DIAGNOSIS — Z79899 Other long term (current) drug therapy: Secondary | ICD-10-CM | POA: Insufficient documentation

## 2017-09-21 DIAGNOSIS — F909 Attention-deficit hyperactivity disorder, unspecified type: Secondary | ICD-10-CM | POA: Diagnosis present

## 2017-09-21 DIAGNOSIS — X719XXA Intentional self-harm by drowning and submersion, unspecified, initial encounter: Secondary | ICD-10-CM | POA: Diagnosis not present

## 2017-09-21 LAB — CBC WITH DIFFERENTIAL/PLATELET
BASOS ABS: 0 10*3/uL (ref 0.0–0.1)
Basophils Relative: 1 %
EOS PCT: 3 %
Eosinophils Absolute: 0.2 10*3/uL (ref 0.0–0.7)
HEMATOCRIT: 41.1 % (ref 39.0–52.0)
Hemoglobin: 14.3 g/dL (ref 13.0–17.0)
LYMPHS PCT: 34 %
Lymphs Abs: 2.2 10*3/uL (ref 0.7–4.0)
MCH: 27.4 pg (ref 26.0–34.0)
MCHC: 34.8 g/dL (ref 30.0–36.0)
MCV: 78.7 fL (ref 78.0–100.0)
Monocytes Absolute: 0.8 10*3/uL (ref 0.1–1.0)
Monocytes Relative: 12 %
NEUTROS ABS: 3.2 10*3/uL (ref 1.7–7.7)
Neutrophils Relative %: 50 %
PLATELETS: 198 10*3/uL (ref 150–400)
RBC: 5.22 MIL/uL (ref 4.22–5.81)
RDW: 13.8 % (ref 11.5–15.5)
WBC: 6.5 10*3/uL (ref 4.0–10.5)

## 2017-09-21 LAB — COMPREHENSIVE METABOLIC PANEL
ALBUMIN: 3.8 g/dL (ref 3.5–5.0)
ALT: 32 U/L (ref 0–44)
AST: 28 U/L (ref 15–41)
Alkaline Phosphatase: 46 U/L (ref 38–126)
Anion gap: 8 (ref 5–15)
BILIRUBIN TOTAL: 0.8 mg/dL (ref 0.3–1.2)
BUN: 14 mg/dL (ref 6–20)
CO2: 27 mmol/L (ref 22–32)
Calcium: 9.2 mg/dL (ref 8.9–10.3)
Chloride: 104 mmol/L (ref 98–111)
Creatinine, Ser: 0.8 mg/dL (ref 0.61–1.24)
GFR calc Af Amer: >60 mL/min (ref >60)
GFR calc non Af Amer: >60 mL/min (ref >60)
GLUCOSE: 93 mg/dL (ref 70–99)
Potassium: 3.3 mmol/L — ABNORMAL LOW (ref 3.5–5.1)
Sodium: 139 mmol/L (ref 135–145)
TOTAL PROTEIN: 7.3 g/dL (ref 6.5–8.1)

## 2017-09-21 LAB — RAPID URINE DRUG SCREEN, HOSP PERFORMED
Amphetamines: NOT DETECTED
Benzodiazepines: NOT DETECTED
Cocaine: NOT DETECTED
Opiates: NOT DETECTED
TETRAHYDROCANNABINOL: NOT DETECTED

## 2017-09-21 LAB — VALPROIC ACID LEVEL: Valproic Acid Lvl: 22 ug/mL — ABNORMAL LOW (ref 50.0–100.0)

## 2017-09-21 LAB — ETHANOL: Alcohol, Ethyl (B): <10 mg/dL (ref <10)

## 2017-09-21 MED ORDER — LORATADINE 10 MG PO TABS: 10.0000 mg | ORAL_TABLET | Freq: Every day | ORAL | Status: DC

## 2017-09-21 MED ORDER — VITAMIN D3 25 MCG (1000 UNIT) PO TABS
2000.0000 [IU] | ORAL_TABLET | Freq: Every day | ORAL | Status: DC
  Filled 2017-09-21: qty 2

## 2017-09-21 MED ORDER — CHLORPROMAZINE HCL 25 MG PO TABS: 200.0000 mg | ORAL_TABLET | Freq: Two times a day (BID) | ORAL | Status: DC

## 2017-09-21 MED ORDER — IMIPRAMINE HCL 25 MG PO TABS: 25.0000 mg | ORAL_TABLET | Freq: Every day | ORAL | Status: DC

## 2017-09-21 MED ORDER — DIVALPROEX SODIUM ER 500 MG PO TB24: 500.0000 mg | ORAL_TABLET | ORAL | Status: DC

## 2017-09-21 MED ORDER — METOPROLOL TARTRATE 25 MG PO TABS: 50.0000 mg | ORAL_TABLET | Freq: Every day | ORAL | Status: DC

## 2017-09-21 MED ORDER — FLUTICASONE PROPIONATE 50 MCG/ACT NA SUSP
1.0000 | Freq: Every day | NASAL | Status: DC
  Filled 2017-09-21: qty 16

## 2017-09-21 MED ORDER — FLUOXETINE HCL 20 MG PO CAPS: 40.0000 mg | ORAL_CAPSULE | Freq: Every morning | ORAL | Status: DC

## 2017-09-21 MED ORDER — ESTRADIOL 1 MG PO TABS
1.0000 mg | ORAL_TABLET | Freq: Every day | ORAL | Status: DC
  Filled 2017-09-21: qty 1

## 2017-09-21 MED ORDER — LORAZEPAM 0.5 MG PO TABS: 0.5000 mg | ORAL_TABLET | Freq: Every day | ORAL | Status: DC

## 2017-09-21 MED ORDER — CLINDAMYCIN PHOS-BENZOYL PEROX 1-5 % EX GEL: 1.0000 "application " | Freq: Every day | CUTANEOUS | Status: DC

## 2017-09-21 MED ORDER — TRAZODONE HCL 100 MG PO TABS: 100.0000 mg | ORAL_TABLET | Freq: Every evening | ORAL | Status: DC | PRN

## 2017-09-21 MED ORDER — HYDROCHLOROTHIAZIDE 12.5 MG PO CAPS: 12.5000 mg | ORAL_CAPSULE | Freq: Every day | ORAL | Status: DC

## 2017-09-21 MED ORDER — FESOTERODINE FUMARATE ER 8 MG PO TB24
8.0000 mg | ORAL_TABLET | Freq: Every day | ORAL | Status: DC
  Filled 2017-09-21: qty 1

## 2017-09-21 NOTE — [facility] Counselor (Signed)
Group home staff aware of Pt's D/C. Group home staff states they are on the way to pick-up the Pt.  Raymond PhoenixBrandi Ival Pacer, [facility] Triage Specialist

## 2017-09-21 NOTE — ED Notes (Signed)
Bed: [facility] Expected date:  Expected time:  Means of arrival:  Comments: 

## 2017-09-21 NOTE — ED Notes (Signed)
Pt stable at time of dc. Group home staff refused to come in and sign for paperwork for pt, so pt had to be assisted to lobby. Pt contracted for safety and ambulatory at time of dc

## 2017-09-21 NOTE — ED Notes (Signed)
Raymond Rosario [phone] [facility]

## 2017-09-21 NOTE — ED Triage Notes (Signed)
Patient brought to ED by [employer] as an IVC. Today, patient tried to drown himself in the bathtub, more aggressive, and refusing to eat. Patient is non verbal at baseline. When asked about pain, patient points to chest but does not answer any questions. Patient alert and calm in no distress.
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
## 2017-09-21 NOTE — Discharge Instructions (Signed)
Continue taking your current meds   See your doctor  Return to ER if you have thoughts of harming yourself or others, hallucinations

## 2017-09-21 NOTE — ED Provider Notes (Signed)
  Physical Exam  BP (!) 132/91 (BP Location: Right Arm)   Pulse (!) 104   Temp 98.5 F (36.9 C) (Oral)   Resp 15   Wt 65.8 kg (145 lb)   SpO2 99%   BMI 26.52 kg/m   Physical Exam  ED Course/Procedures   Clinical Course as of Sep 22 1830  Wed Sep 21, 2017  1307 Patient is on a court issued IVC and this is already been signed by [place] magistrate.   [MB]    Clinical Course User Index [MB] Terrilee FilesButler, Michael C, MD    Procedures  MDM  Patient seen by psych and doesn't meet inpatient admission criteria. Psych recommend discharge. Patient denies any SI or HI currently. I verified with nurse regarding IVC. Nurse states that patient is actually not under IVC. Previous documentation likely in error. I was unable to find any IVC paperwork on him. He doesn't meet psych admission or IVC criteria currently anyway.      Charlynne PanderYao, Lakelyn Straus Hsienta, MD 09/21/17 [phone]

## 2017-09-21 NOTE — ED Notes (Signed)
TTS at bedside. 
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
## 2017-10-15 ENCOUNTER — Encounter (HOSPITAL_COMMUNITY): Payer: Self-pay | Admitting: *Deleted

## 2017-10-15 ENCOUNTER — Ambulatory Visit (HOSPITAL_COMMUNITY)
Admission: EM | Admit: 2017-10-15 | Discharge: 2017-10-15 | Disposition: A | Discharge status: Home / Self Care | Payer: Medicaid Other | Attending: Family | Admitting: Family

## 2017-10-15 ENCOUNTER — Other Ambulatory Visit: Payer: Self-pay

## 2017-10-15 DIAGNOSIS — I10 Essential (primary) hypertension: Secondary | ICD-10-CM | POA: Diagnosis not present

## 2017-10-15 DIAGNOSIS — Z760 Encounter for issue of repeat prescription: Secondary | ICD-10-CM

## 2017-10-15 MED ORDER — PROPRANOLOL HCL 20 MG PO TABS: 20.0000 mg | ORAL_TABLET | Freq: Three times a day (TID) | ORAL | 0 refills | Status: AC

## 2017-10-15 NOTE — ED Provider Notes (Signed)
[facility]    CSN: [id number] Arrival date & time: 10/15/17  1144     History   Chief Complaint Chief Complaint  Patient presents with  . medication refills    HPI Raymond Rosario is a 22 y.o. male.   CC: Medication refill for propranolol 20mg  TID  Accompanied by caregiver who states when she arrived at work yesterday she received a note for manager that patient was out of his propranolol.  She  is unsure how long is been out of medication.  She is no other concerns or complaints today.  She states he is behaving well today.  He has not tried to hurt himself.   She notes that she tried to go to see [facility] where PCP is yesterday and he was unable to be seen.      Lives in group home  Seen in ED 09/21/17  Past Medical History:  Diagnosis Date  . ADHD (attention deficit hyperactivity disorder)   . Mental retardation     Patient Active Problem List   Diagnosis Date Noted  . ADHD (attention deficit hyperactivity disorder), combined type 01/17/2016  . Intellectual disability 01/17/2016  . Intermittent explosive disorder 01/17/2016    History reviewed. No pertinent surgical history.     Home Medications    Prior to Admission medications   Medication Sig Start Date End Date Taking? Authorizing Provider  cetirizine (ZYRTEC) 10 MG tablet Take 10 mg by mouth every morning.    [provider]  Cholecalciferol (VITAMIN D3) 2000 units capsule Take 2,000 Units by mouth daily.    [provider]  clonazePAM (KLONOPIN) 0.5 MG tablet Take 1 tablet (0.5 mg total) by mouth 2 (two) times daily. 09/02/17   Eustace MooreNelson, Yvonne Sue, MD  desmopressin (DDAVP) 0.2 MG tablet Take 1-2 tablets (0.2-0.4 mg total) by mouth 2 (two) times daily. Take 1 tablet every morning and 2 tablets every night Patient taking differently: Take 0.2-0.4 mg by mouth See admin instructions. Take 0.2 mg by mouth every morning then take 0.4 mg by mouth every evening 09/02/17   Eustace MooreNelson,  Yvonne Sue, MD  divalproex (DEPAKOTE) 250 MG DR tablet Take 250 mg by mouth 2 (two) times daily. Give with 500 mg tablets = 1250 mg/dose    [provider]  divalproex (DEPAKOTE) 500 MG DR tablet Take 1,000 mg by mouth 2 (two) times daily. Give with 250 mg tablet = 1250 mg/dose    [provider]  estradiol (ESTRACE) 1 MG tablet Take 1 mg by mouth daily. 12/07/16   [provider]  fesoterodine (TOVIAZ) 8 MG TB24 tablet Take 8 mg by mouth daily.    [provider]  fluticasone (FLONASE) 50 MCG/ACT nasal spray Place 1 spray into both nostrils daily. 12/07/16   [provider]  guaiFENesin (ROBITUSSIN) 100 MG/5ML liquid Take 5-10 mLs (100-200 mg total) by mouth every 4 (four) hours as needed for cough. Patient not taking: Reported on 09/21/2017 05/20/17   Arthor CaptainHarris, Abigail, PA-C  haloperidol (HALDOL) 5 MG tablet Take 2.5-5 mg by mouth See admin instructions. Take 2.5 mg by mouth in the morning and 5 mg by mouth in the afternoon    [provider]  hydrochlorothiazide (HYDRODIURIL) 12.5 MG tablet Take 12.5 mg by mouth daily. 12/07/16   [provider]  meloxicam (MOBIC) 15 MG tablet Take 15 mg by mouth daily.    [provider]  paliperidone (INVEGA SUSTENNA) 156 MG/ML SUSY injection Inject 156 mg into the  muscle every 30 (thirty) days.    [provider]  paliperidone (INVEGA) 6 MG 24 hr tablet Take 6 mg by mouth at bedtime.    [provider]  propranolol (INDERAL) 20 MG tablet Take 1 tablet (20 mg total) by mouth 3 (three) times daily. 10/15/17   Allegra Grana, FNP  tiZANidine (ZANAFLEX) 2 MG tablet Take 2-4 mg by mouth every 8 (eight) hours as needed for muscle spasms.    [provider]    Family History History reviewed. No pertinent family history.  Social History Social History   Tobacco Use  . Smoking status: Former Games developer  . Smokeless tobacco: Never Used  Substance Use Topics  . Alcohol  use: No  . Drug use: No     Allergies   Patient has no known allergies.   Review of Systems Review of Systems  Constitutional: Negative for chills and fever.  Respiratory: Negative for cough.   Cardiovascular: Negative for chest pain.  Gastrointestinal: Negative for nausea and vomiting.     Physical Exam Triage Vital Signs ED Triage Vitals  Enc Vitals Group     BP 10/15/17 1231 129/80     Pulse Rate 10/15/17 1231 96     Resp 10/15/17 1231 16     Temp 10/15/17 1231 98.5 F (36.9 C)     Temp Source 10/15/17 1231 Oral     SpO2 10/15/17 1231 100 %     Weight --      Height --      Head Circumference --      Peak Flow --      Pain Score 10/15/17 1233 0     Pain Loc --      Pain Edu? --      Excl. in GC? --    No data found.  Updated Vital Signs BP 129/80 (BP Location: Left Arm)   Pulse 96   Temp 98.5 F (36.9 C) (Oral)   Resp 16   SpO2 100%   Visual Acuity Right Eye Distance:   Left Eye Distance:   Bilateral Distance:    Right Eye Near:   Left Eye Near:    Bilateral Near:     Physical Exam  Constitutional: He appears well-developed and well-nourished.  Cardiovascular: Regular rhythm and normal heart sounds.  Pulmonary/Chest: Effort normal and breath sounds normal. No respiratory distress. He has no wheezes. He has no rhonchi. He has no rales.  Lymphadenopathy:       Head (left side): No submandibular and no preauricular adenopathy present.  Neurological: He is alert.  Nonverbal Smiling No distress  Skin: Skin is warm and dry.  Psychiatric: He has a normal mood and affect. His speech is normal and behavior is normal.  Vitals reviewed.    UC Treatments / Results  Labs (all labs ordered are listed, but only abnormal results are displayed) Labs Reviewed - No data to display  EKG None  Radiology No results found.  Procedures Procedures (including critical care time)  Medications Ordered in UC Medications - No data to display  Initial  Impression / Assessment and Plan / UC Course  I have reviewed the triage vital signs and the nursing notes.  Pertinent labs & imaging results that were available during my care of the patient were reviewed by me and considered in my medical decision making (see chart for details).      Final Clinical Impressions(s) / UC Diagnoses   Final diagnoses:  Essential hypertension  Medication refill  Patient is well-appearing, smiling at me.  He does not appear in distress.  He is nonverbal and caregiver provides history.  I reviewed prior recent emergency room visit and patient hascomplex medical history.  however today caregiver states she has no concerns regarding his behavior and he has not tried to hurt himself. She has no other concerns.   Blood pressure is well controlled today.  I have refilled his blood pressure medication for month supply and advised them to follow-up with PCP for further refills.   Discharge Instructions     Pleasure meeting you Happy to provide you of a refill of propranolol. I have given you ONE month.   Ensure you monitor your blood pressure , goal is < 130/80 and make a follow up with [facility] , your primary care provider, as soon as possible for further refills of propranolol.    If there is no improvement in your symptoms, or if there is any worsening of symptoms, or if you have any additional concerns, please return for re-evaluation; or, if we are closed, consider going to the Emergency Room for evaluation if symptoms urgent.     ED Prescriptions    Medication Sig Dispense Auth. Provider   propranolol (INDERAL) 20 MG tablet Take 1 tablet (20 mg total) by mouth 3 (three) times daily. 90 tablet Allegra Grana, FNP     Controlled Substance Prescriptions [place] Controlled Substance Registry consulted? Not Applicable   Allegra Grana, FNP 10/15/17 1623

## 2017-10-15 NOTE — Discharge Instructions (Signed)
Pleasure meeting you Happy to provide you of a refill of propranolol. I have given you ONE month.   Ensure you monitor your blood pressure , goal is < 130/80 and make a follow up with [facility] , your primary care provider, as soon as possible for further refills of propranolol.    If there is no improvement in your symptoms, or if there is any worsening of symptoms, or if you have any additional concerns, please return for re-evaluation; or, if we are closed, consider going to the Emergency Room for evaluation if symptoms urgent.

## 2017-10-15 NOTE — ED Triage Notes (Signed)
Patient is here with his care taker. Patient is here for medication refill for his Propranolol 20 mg TID

## 2017-11-07 ENCOUNTER — Other Ambulatory Visit: Payer: Self-pay | Admitting: Family

## 2018-11-25 IMAGING — CT CT NECK W/ CM
3 of 4 series · 13 of 33 positions shown, 16 images · IV contrast (APPLIED)
Comparison: None.

CLINICAL DATA: Sore throat.  Loss of appetite.

EXAM:
CT NECK WITH CONTRAST
TECHNIQUE: Multidetector CT imaging of the neck was performed using the
standard protocol following the bolus administration of intravenous
contrast.
CONTRAST:  75mL OMNIPAQUE IOHEXOL 300 MG/ML  SOLN

[Series 3: neck 2.0 i31s 3 · axial · 0.50mm/px · z∈[-230,-102]mm · 5 of 96 slices shown, 7 images]
[im 16/96  soft-tissue]
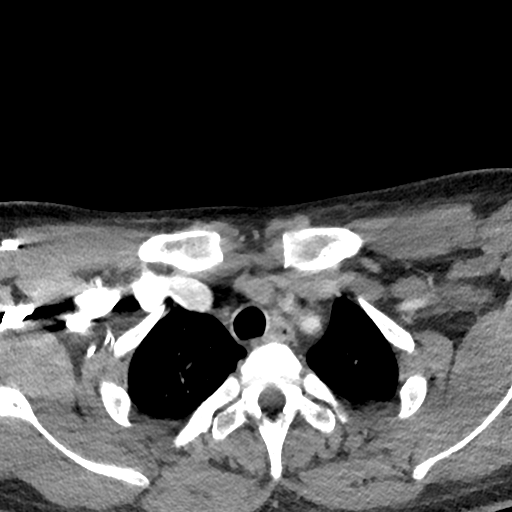
[im 16/96  bone]
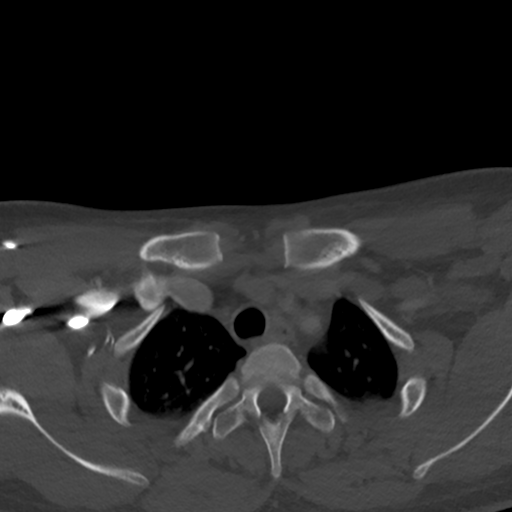
[im 32/96  bone]
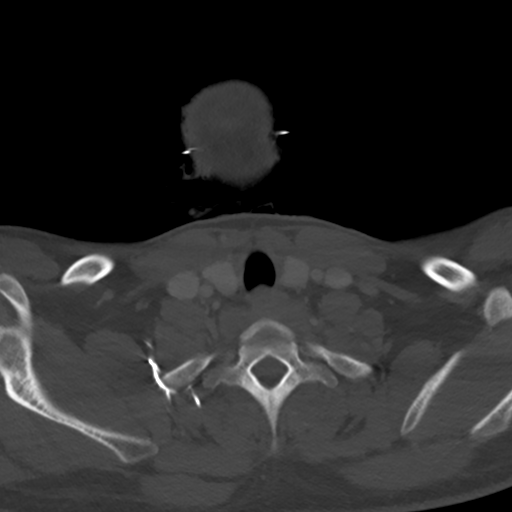
[im 48/96  bone]
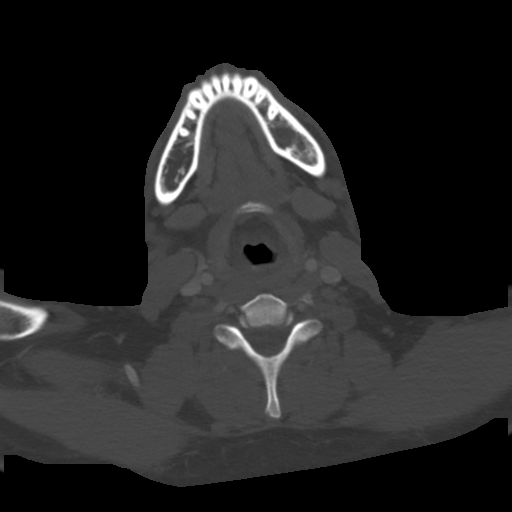
[im 64/96  bone]
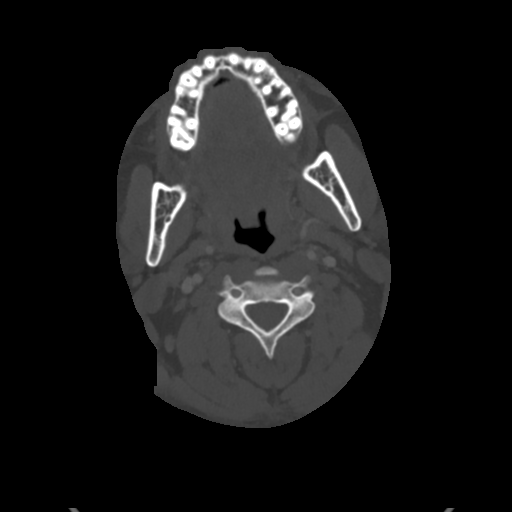
[im 80/96  soft-tissue]
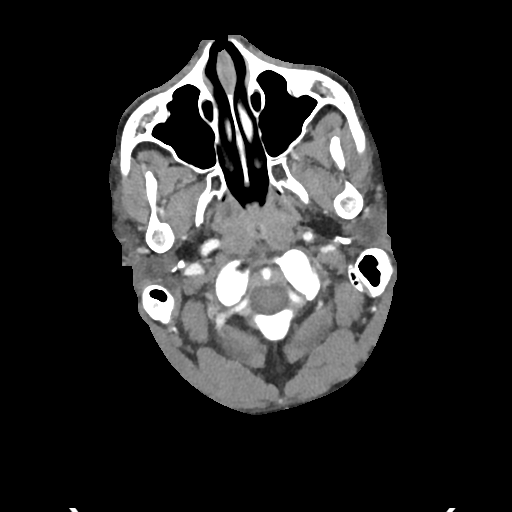
[im 80/96  bone]
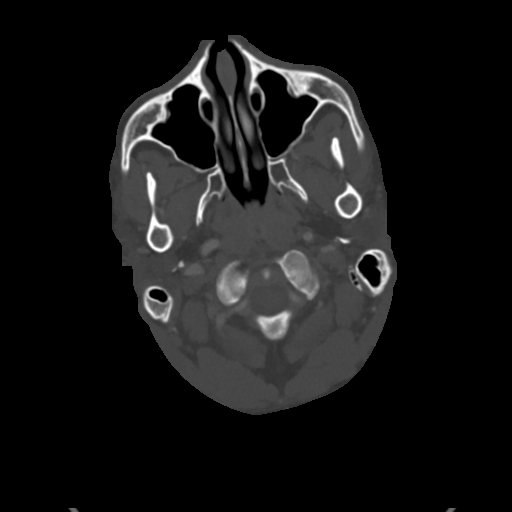

[Series 7: coronal st · coronal · 0.38mm/px · 3 of 119 slices shown]
[im 24/119  bone]
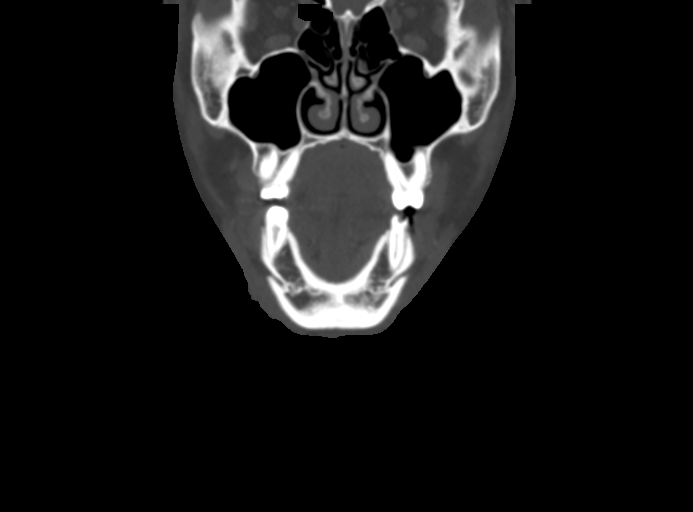
[im 48/119  bone]
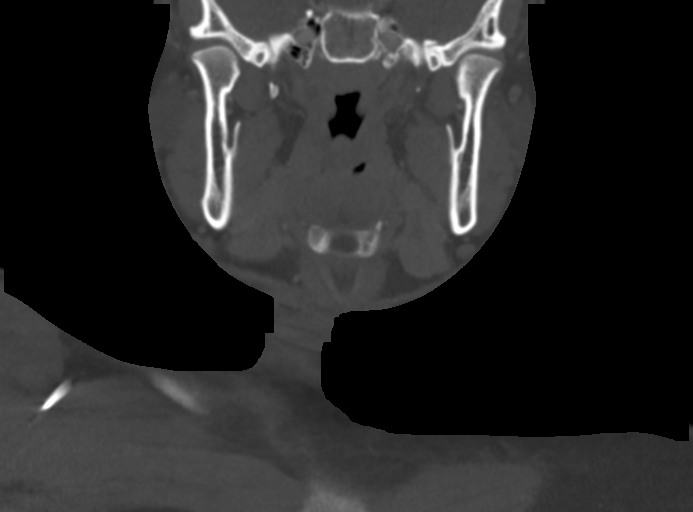
[im 71/119  bone]
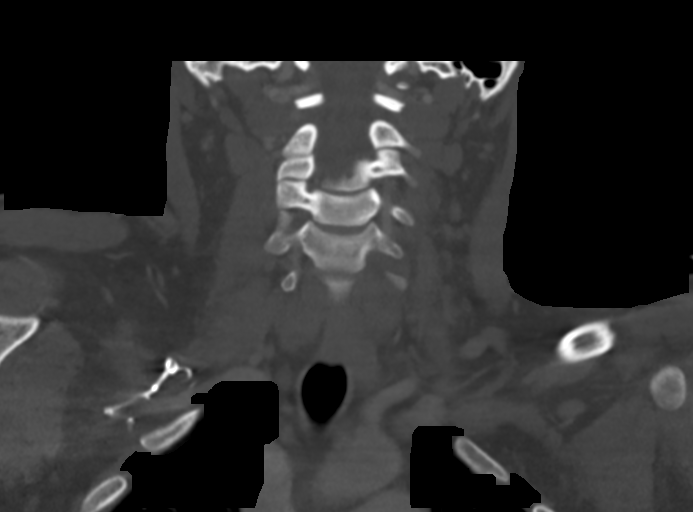

[Series 8: sagittal st · sagittal · 0.39mm/px · 5 of 113 slices shown, 6 images]
[im 38/113  bone]
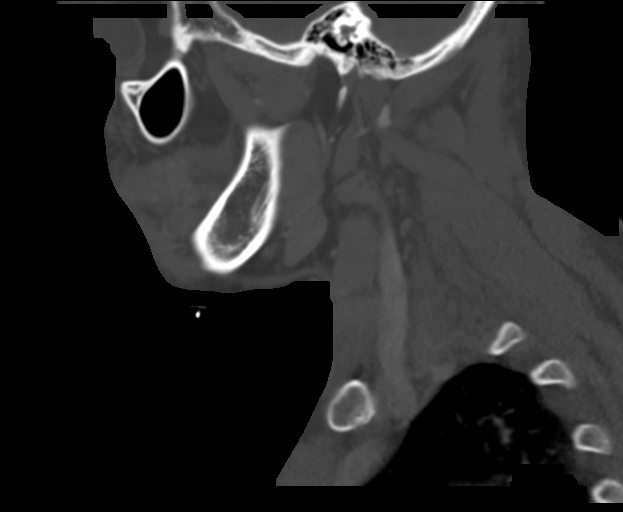
[im 47/113  bone]
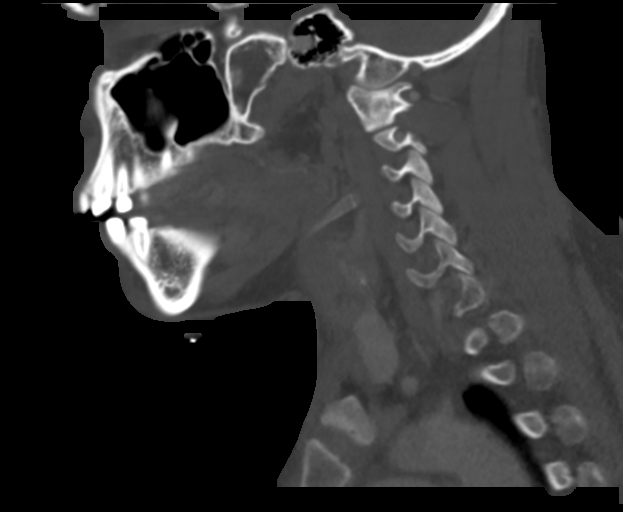
[im 57/113  soft-tissue]
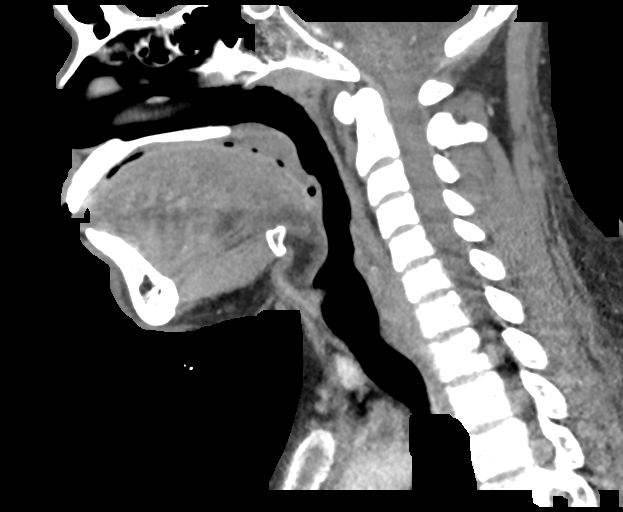
[im 57/113  bone]
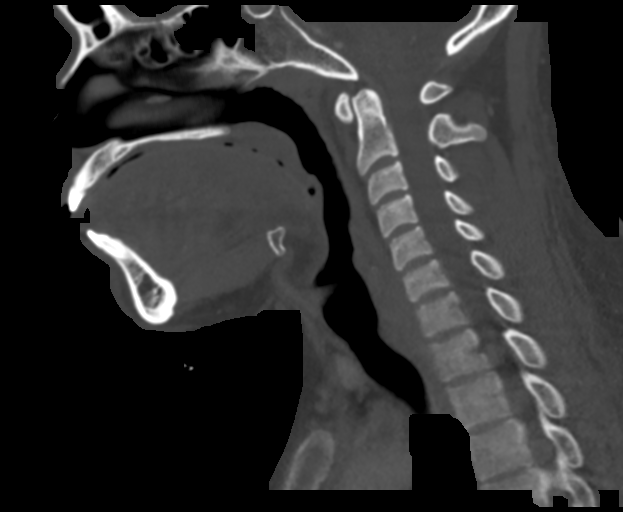
[im 66/113  bone]
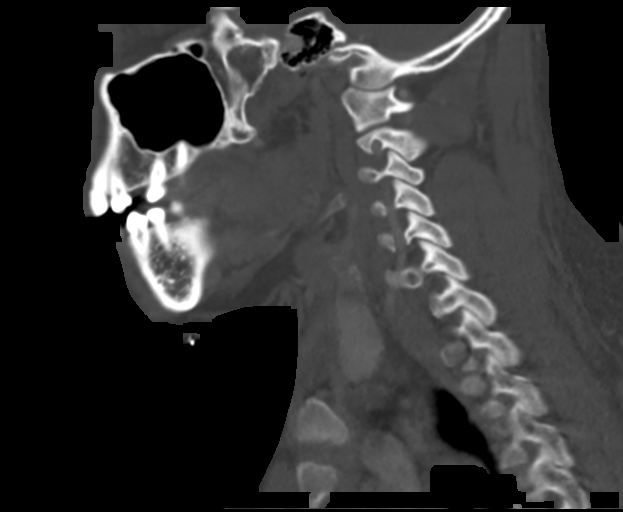
[im 75/113  bone]
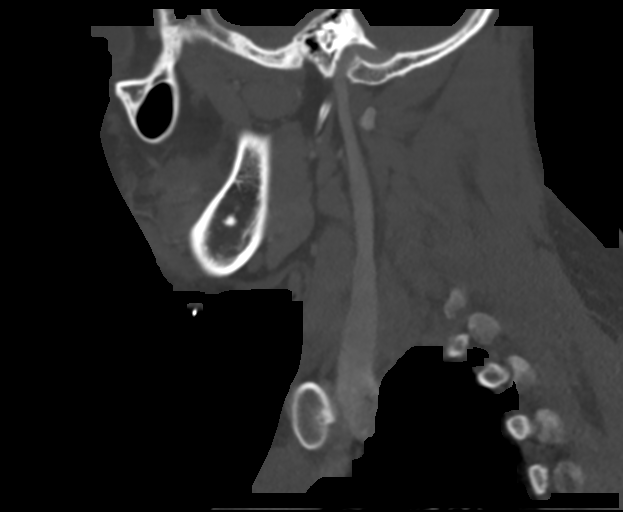

[13 of 33 positions shown; findings below may reference images not displayed]

FINDINGS: Pharynx and larynx: Symmetric pharyngeal soft tissues without
evidence of mass or swelling. Slight symmetric prominence of the
nasopharyngeal soft tissues. No fluid collection or significant
inflammatory changes in the parapharyngeal or retropharyngeal
spaces. Unremarkable larynx.

Salivary glands: No inflammation, mass, or stone.

Thyroid: Unremarkable.

Lymph nodes: At most minimally prominent level II and lateral
retropharyngeal lymph nodes bilaterally, benign/reactive in
appearance.

Vascular: Major vascular structures of the neck are patent.

Limited intracranial: Mild enlargement of the cisterna magna, a
normal variant.

Visualized orbits: Unremarkable.

Mastoids and visualized paranasal sinuses: Clear.

Skeleton: No acute osseous abnormality or suspicious osseous lesion.

Upper chest: Clear lung apices.

Other: None.
IMPRESSION: No abscess or other acute abnormality identified in the neck.

## 2019-01-12 ENCOUNTER — Other Ambulatory Visit: Payer: Self-pay

## 2019-01-12 DIAGNOSIS — Z20822 Contact with and (suspected) exposure to covid-19: Secondary | ICD-10-CM

## 2019-01-13 LAB — NOVEL CORONAVIRUS, NAA: SARS-CoV-2, NAA: NOT DETECTED

## 2019-03-24 ENCOUNTER — Ambulatory Visit (HOSPITAL_COMMUNITY)
Admission: RE | Admit: 2019-03-24 | Discharge: 2019-03-24 | Disposition: A | Discharge status: Home / Self Care | Payer: Medicaid Other | Attending: Psychiatry | Admitting: Psychiatry

## 2019-03-24 ENCOUNTER — Encounter (HOSPITAL_COMMUNITY): Payer: Self-pay | Admitting: Psychiatry

## 2019-03-24 DIAGNOSIS — F209 Schizophrenia, unspecified: Secondary | ICD-10-CM | POA: Insufficient documentation

## 2019-03-24 DIAGNOSIS — F319 Bipolar disorder, unspecified: Secondary | ICD-10-CM | POA: Insufficient documentation

## 2019-03-24 DIAGNOSIS — Z87891 Personal history of nicotine dependence: Secondary | ICD-10-CM | POA: Diagnosis not present

## 2019-03-24 DIAGNOSIS — F909 Attention-deficit hyperactivity disorder, unspecified type: Secondary | ICD-10-CM | POA: Diagnosis present

## 2019-03-24 DIAGNOSIS — Z7289 Other problems related to lifestyle: Secondary | ICD-10-CM | POA: Diagnosis not present

## 2019-03-24 DIAGNOSIS — R45851 Suicidal ideations: Secondary | ICD-10-CM | POA: Insufficient documentation
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
## 2019-03-24 NOTE — BH Assessment (Signed)
Assessment Note  Raymond Rosario is a 24 y.o. male who presented to [facility] on involuntary basis (petitioner is Investment banker, operational of Pt's group home, [facility]) after eloping from his group home several times today, threatening to break property owned by staff, and expressing passive suicidal ideation.  Pt lives at [facility] group home -- [phone].  Per notes, Pt has a history of I/DD, but no documentation is available to confirm.  Per IVC petition:  [name] is schizophrenic, bipolar.  [name] runs away and drinks, come back to the group home drunk and threatens the staff members.  [name] states he wants to die.  [name] is a danger to himself and others at this time and needs to be evaluated for possible mental illness.  Author could not reach IVC petitioner, but was able to speak with Mercer Pod, a case manager at the group home.  Ms. Baird Cancer reported that Pt has eloped from the group home three times on 03/23/2019 and twice this morning.  In the last instance, he is reported to have gone to a liquor store and purchased and consumed a beer.  Per Ms. Baird Cancer, Pt has threatened to break items belonging to other staff, and he said, ''I don't care if I die.''  Ms. Baird Cancer reported that Pt frequently argues with other group home members and staff, and he stated that he does not like it at the group home.  Ms. Baird Cancer could not confirm where or if Pt receives outpatient psychiatry services.    Pryor Curia and Molly Maduro, NP, assessed Pt.  Pt spoke with some difficulty due to a speech impediment.  He acknowledged to leaving the group home and drinking a beer.  He denied suicidal ideation, past suicide attempts, depressive symptoms, homicidal ideation, hallucination, self-injurious behavior, and substance use concerns.  Pt stated that he likes his group home and wants to return.    During assessment, Pt presented as alert and oriented.  He had good eye contact.  Demeanor was calm.  Pt's mood was  euthymic.  Affect was pleasant.  Speech was slow, soft, and difficult to understand due to apparent speech impediment.  Pt's thought processes were within normal range, and thought content was logical.  There was no evidence of delusion.  Pt's memory and concentration were fair.  Insight, judgment, and impulse control were fair to poor as evidenced by elopement.  This may be baseline for Pt given reported IDD.  Consulted with Molly Maduro, NP, who also spoke with Pt.  Pt is psych-cleared.   Diagnosis: ADHD  Past Medical History:  Past Medical History:  Diagnosis Date  . ADHD (attention deficit hyperactivity disorder)   . Mental retardation     No past surgical history on file.  Family History: No family history on file.  Social History:  reports that he has quit smoking. He has never used smokeless tobacco. He reports current alcohol use of about 1.0 standard drinks of alcohol per week. He reports that he does not use drugs.  Additional Social History:  Alcohol / Drug Use Pain Medications: See MAR Prescriptions: See MAR Over the Counter: See MAR History of alcohol / drug use?: Yes Substance #1 Name of Substance 1: Alcohol 1 - Last Use / Amount: 1 12 oz beer 03/24/2019  CIWA:   COWS:    Allergies: No Known Allergies  Home Medications: (Not in a hospital admission)   OB/GYN Status:  No LMP for male patient.  General Assessment Data Location of Assessment: [facility]  Assessment Services TTS Assessment: In system Is this a Tele or Face-to-Face Assessment?: Face-to-Face Is this an Initial Assessment or a Re-assessment for this encounter?: Initial Assessment Patient Accompanied by:: N/A(Transported by [employer] due to IVC) Language Other than English: No Living Arrangements: In Group Home: (Comment: Name of Group Home)([facility]) What gender do you identify as?: Male Marital status: Single Pregnancy Status: No Living Arrangements: Group Home Can pt return to current living arrangement?:  Yes Admission Status: Involuntary Petitioner: Other(Group House Program Director Blair Heys) Is patient capable of signing voluntary admission?: Yes Referral Source: Other(Group House)     Crisis Care Plan Living Arrangements: Group Home  Education Status Is patient currently in school?: No Is the patient employed, unemployed or receiving disability?: Receiving disability income  Risk to self with the past 6 months Suicidal Ideation: No(See notes) Has patient been a risk to self within the past 6 months prior to admission? : No Suicidal Intent: No Has patient had any suicidal intent within the past 6 months prior to admission? : No Is patient at risk for suicide?: No Suicidal Plan?: No Has patient had any suicidal plan within the past 6 months prior to admission? : No Access to Means: No What has been your use of drugs/alcohol within the last 12 months?: Had a beer today Previous Attempts/Gestures: No Intentional Self Injurious Behavior: None Family Suicide History: Unknown Recent stressful life event(s): Conflict (Comment)(Conflict at group home) Persecutory voices/beliefs?: No Depression: No Depression Symptoms: Feeling angry/irritable Substance abuse history and/or treatment for substance abuse?: No Suicide prevention information given to non-admitted patients: Not applicable  Risk to Others within the past 6 months Homicidal Ideation: No Does patient have any lifetime risk of violence toward others beyond the six months prior to admission? : No Thoughts of Harm to Others: No-Not Currently Present/Within Last 6 Months(See notes) Current Homicidal Intent: No Current Homicidal Plan: No Access to Homicidal Means: No History of harm to others?: No Assessment of Violence: None Noted Does patient have access to weapons?: No Criminal Charges Pending?: No Does patient have a court date: No Is patient on probation?: No  Psychosis Hallucinations: None noted Delusions:  None noted  Mental Status Report Appearance/Hygiene: Other (Comment), Unremarkable(Street clothes) Eye Contact: Good Motor Activity: Freedom of movement, Unremarkable Speech: Soft, Slow, Other (Comment)(Apparent speech impediment) Level of Consciousness: Alert Mood: Euthymic Affect: Appropriate to circumstance Anxiety Level: None Thought Processes: Coherent, Relevant Judgement: Impaired Orientation: Person, Place, Time, Situation Obsessive Compulsive Thoughts/Behaviors: None  Cognitive Functioning Concentration: Fair Memory: Remote Intact, Recent Intact Is patient IDD: Yes(Per history, Pt is I/DD, MR) Level of Function: (Unknown) Insight: Poor Impulse Control: Poor(As evidneced by eloping) Appetite: Good Have you had any weight changes? : No Change Sleep: No Change Vegetative Symptoms: None  ADLScreening [facility] Assessment Services) Patient's cognitive ability adequate to safely complete daily activities?: Yes Patient able to express need for assistance with ADLs?: Yes Independently performs ADLs?: Yes (appropriate for developmental age)        ADL Screening (condition at time of admission) Patient's cognitive ability adequate to safely complete daily activities?: Yes Is the patient deaf or have difficulty hearing?: No Does the patient have difficulty seeing, even when wearing glasses/contacts?: No Does the patient have difficulty concentrating, remembering, or making decisions?: No Patient able to express need for assistance with ADLs?: Yes Does the patient have difficulty dressing or bathing?: No Independently performs ADLs?: Yes (appropriate for developmental age) Does the patient have difficulty walking or climbing stairs?: No Weakness  of Legs: None Weakness of Arms/Hands: None  Home Assistive Devices/Equipment Home Assistive Devices/Equipment: None  Therapy Consults (therapy consults require a physician order) PT Evaluation Needed: No OT Evalulation Needed:  No SLP Evaluation Needed: No Abuse/Neglect Assessment (Assessment to be complete while patient is alone) Abuse/Neglect Assessment Can Be Completed: Yes Physical Abuse: Denies Verbal Abuse: Denies Sexual Abuse: Denies Exploitation of patient/patient's resources: Denies Self-Neglect: Denies Values / Beliefs Cultural Requests During Hospitalization: None Spiritual Requests During Hospitalization: None Consults Spiritual Care Consult Needed: No Transition of Care Team Consult Needed: No Advance Directives (For Healthcare) Does Patient Have a Medical Advance Directive?: No          Disposition:  Disposition Initial Assessment Completed for this Encounter: Yes Disposition of Patient: Discharge(Per Roselie Skinner, NP, Pt does not meet inpt criteria)  On Site Evaluation by:   Reviewed with Physician:    Dorris Fetch Yitzhak Awan 03/24/2019 4:38 PM

## 2019-03-26 ENCOUNTER — Emergency Department (HOSPITAL_COMMUNITY)
Admission: EM | Admit: 2019-03-26 | Discharge: 2019-03-27 | Disposition: A | Discharge status: Home / Self Care | Payer: Medicaid Other | Attending: Emergency Medicine | Admitting: Emergency Medicine

## 2019-03-26 DIAGNOSIS — Z046 Encounter for general psychiatric examination, requested by authority: Secondary | ICD-10-CM | POA: Diagnosis present

## 2019-03-26 DIAGNOSIS — F902 Attention-deficit hyperactivity disorder, combined type: Secondary | ICD-10-CM | POA: Diagnosis present

## 2019-03-26 DIAGNOSIS — Z20822 Contact with and (suspected) exposure to covid-19: Secondary | ICD-10-CM | POA: Diagnosis not present

## 2019-03-26 DIAGNOSIS — F71 Moderate intellectual disabilities: Secondary | ICD-10-CM | POA: Diagnosis not present

## 2019-03-26 DIAGNOSIS — F79 Unspecified intellectual disabilities: Secondary | ICD-10-CM

## 2019-03-26 DIAGNOSIS — R45851 Suicidal ideations: Secondary | ICD-10-CM | POA: Insufficient documentation

## 2019-03-26 DIAGNOSIS — Z87891 Personal history of nicotine dependence: Secondary | ICD-10-CM | POA: Insufficient documentation

## 2019-03-26 DIAGNOSIS — F909 Attention-deficit hyperactivity disorder, unspecified type: Secondary | ICD-10-CM | POA: Diagnosis not present

## 2019-03-26 DIAGNOSIS — F6381 Intermittent explosive disorder: Secondary | ICD-10-CM | POA: Diagnosis not present

## 2019-03-26 DIAGNOSIS — Z79899 Other long term (current) drug therapy: Secondary | ICD-10-CM | POA: Insufficient documentation

## 2019-03-26 LAB — COMPREHENSIVE METABOLIC PANEL
ALT: 23 U/L (ref 0–44)
AST: 38 U/L (ref 15–41)
Albumin: 4.1 g/dL (ref 3.5–5.0)
Alkaline Phosphatase: 65 U/L (ref 38–126)
Anion gap: 12 (ref 5–15)
BUN: 16 mg/dL (ref 6–20)
CO2: 25 mmol/L (ref 22–32)
Calcium: 9.4 mg/dL (ref 8.9–10.3)
Chloride: 100 mmol/L (ref 98–111)
Creatinine, Ser: 1 mg/dL (ref 0.61–1.24)
GFR calc Af Amer: >60 mL/min (ref >60)
GFR calc non Af Amer: >60 mL/min (ref >60)
Glucose, Bld: 117 mg/dL — ABNORMAL HIGH (ref 70–99)
Potassium: 3.7 mmol/L (ref 3.5–5.1)
Sodium: 137 mmol/L (ref 135–145)
Total Bilirubin: 0.9 mg/dL (ref 0.3–1.2)
Total Protein: 7.2 g/dL (ref 6.5–8.1)

## 2019-03-26 LAB — CBC WITH DIFFERENTIAL/PLATELET
Abs Immature Granulocytes: 0.04 10*3/uL (ref 0.00–0.07)
Basophils Absolute: 0 10*3/uL (ref 0.0–0.1)
Basophils Relative: 0 %
Eosinophils Absolute: 0.1 10*3/uL (ref 0.0–0.5)
Eosinophils Relative: 1 %
HCT: 41.9 % (ref 39.0–52.0)
Hemoglobin: 14 g/dL (ref 13.0–17.0)
Immature Granulocytes: 0 %
Lymphocytes Relative: 39 %
Lymphs Abs: 4 10*3/uL (ref 0.7–4.0)
MCH: 26.9 pg (ref 26.0–34.0)
MCHC: 33.4 g/dL (ref 30.0–36.0)
MCV: 80.6 fL (ref 80.0–100.0)
Monocytes Absolute: 0.7 10*3/uL (ref 0.1–1.0)
Monocytes Relative: 7 %
Neutro Abs: 5.3 10*3/uL (ref 1.7–7.7)
Neutrophils Relative %: 53 %
Platelets: 233 10*3/uL (ref 150–400)
RBC: 5.2 MIL/uL (ref 4.22–5.81)
RDW: 13.9 % (ref 11.5–15.5)
WBC: 10.1 10*3/uL (ref 4.0–10.5)
nRBC: 0 % (ref 0.0–0.2)

## 2019-03-26 LAB — VALPROIC ACID LEVEL: Valproic Acid Lvl: 51 ug/mL (ref 50.0–100.0)

## 2019-03-26 LAB — ETHANOL: Alcohol, Ethyl (B): <10 mg/dL (ref <10)

## 2019-03-26 MED ORDER — HYDROCHLOROTHIAZIDE 25 MG PO TABS
12.5000 mg | ORAL_TABLET | Freq: Every day | ORAL | Status: DC
  Administered 2019-03-27: 10:00:00 12.5 mg via ORAL
  Filled 2019-03-26: qty 0.5

## 2019-03-26 MED ORDER — FLUTICASONE PROPIONATE 50 MCG/ACT NA SUSP
1.0000 | Freq: Every day | NASAL | Status: DC
  Administered 2019-03-27: 1 via NASAL
  Filled 2019-03-26: qty 16

## 2019-03-26 MED ORDER — LORATADINE 10 MG PO TABS
10.0000 mg | ORAL_TABLET | Freq: Every day | ORAL | Status: DC
  Administered 2019-03-27: 10 mg via ORAL
  Filled 2019-03-26: qty 1

## 2019-03-26 MED ORDER — CLONAZEPAM 0.5 MG PO TABS
0.5000 mg | ORAL_TABLET | Freq: Two times a day (BID) | ORAL | Status: DC
  Administered 2019-03-27: 0.5 mg via ORAL
  Filled 2019-03-26: qty 1

## 2019-03-26 MED ORDER — TAMSULOSIN HCL 0.4 MG PO CAPS
0.4000 mg | ORAL_CAPSULE | Freq: Every day | ORAL | Status: DC
  Filled 2019-03-26: qty 1

## 2019-03-26 MED ORDER — DIVALPROEX SODIUM 250 MG PO DR TAB
250.0000 mg | DELAYED_RELEASE_TABLET | Freq: Two times a day (BID) | ORAL | Status: DC
  Administered 2019-03-27: 250 mg via ORAL
  Filled 2019-03-26: qty 1

## 2019-03-26 MED ORDER — PROPRANOLOL HCL 20 MG PO TABS
20.0000 mg | ORAL_TABLET | Freq: Three times a day (TID) | ORAL | Status: DC
  Administered 2019-03-27 (×2): 20 mg via ORAL
  Filled 2019-03-26 (×3): qty 1

## 2019-03-26 MED ORDER — VITAMIN D3 25 MCG (1000 UNIT) PO TABS
2000.0000 [IU] | ORAL_TABLET | Freq: Every day | ORAL | Status: DC
  Administered 2019-03-27: 2000 [IU] via ORAL
  Filled 2019-03-26: qty 2

## 2019-03-26 MED ORDER — DIVALPROEX SODIUM 500 MG PO DR TAB
1000.0000 mg | DELAYED_RELEASE_TABLET | Freq: Two times a day (BID) | ORAL | Status: DC
  Administered 2019-03-27: 1000 mg via ORAL
  Filled 2019-03-26: qty 2

## 2019-03-26 MED ORDER — DESMOPRESSIN ACETATE 0.2 MG PO TABS
0.4000 mg | ORAL_TABLET | Freq: Every day | ORAL | Status: DC
  Filled 2019-03-26 (×2): qty 2

## 2019-03-26 MED ORDER — FESOTERODINE FUMARATE ER 8 MG PO TB24
8.0000 mg | ORAL_TABLET | Freq: Every day | ORAL | Status: DC
  Administered 2019-03-27: 8 mg via ORAL
  Filled 2019-03-26: qty 1

## 2019-03-26 NOTE — Progress Notes (Signed)
Received Raymond Rosario at the change of shift with [employer], he was oriented to his environment and allowed to make a phone call. Later he spoke with TTS. He received a snack of his choice. This Clinical research associate spoke with the group home manager at [facility] Madilyn Hook at [phone] who verified all of the scheduled medications were given to this patient for the day prior to transport to the hospital.  He eventually drifted off to sleep and slept throughout the night without incident.

## 2019-03-26 NOTE — ED Notes (Signed)
Pt unable to give urine sample right now pt doesn't have to use the restroom, but pt is aware we need a sample and will let us know when he have to use the restroom.

## 2019-03-26 NOTE — BH Assessment (Addendum)
Tele Assessment Note   Patient Name: Raymond Rosario MRN: [id number] Referring Physician: Dyanne Carrel, PA-C Location of Patient: [place] Location of Provider: Behavioral Health TTS Department  Raymond Rosario is an 24 y.o. male. Pt presented to [facility] voluntarily accompanied by [employer] for suicidal ideation. Pt was asked what brought him in tonight, pt states, " I want to kill myself". Pt states he wants to shoot self with a gun but denied access to any weapons. Pt presented to the [facility] just a few days ago on 03/23/18. Pt was IVC by group home director Raymond Rosario for property destruction, and passive SI. Pt currently resides at [facility] group home. Per his chart pt has history of IDD.  Upon assessing pt he did present to have delay in his responses and his speech. Pt answered most questions shortly. Pt denied HI, SIB, AVH and any substance use. Pt kept repeating," they don't feed me". Pt was asked about this statement. Pt however states he last ate yesterday. Pt responded " I don't know" to all other questions.   Pt oriented x3. During assessment, Pt presented as alert and oriented.  He had fair eye contact.  Pt's mood was euthymic.  Affect was pleasant.  Speech was slow, soft, and difficult to understand.  Pt's thought processes were within normal range, and thought content was logical.  There was no evidence of delusion.  Pt's memory and concentration were fair.  Insight, judgment, and impulse control were fair to poor as evidenced by elopement.  This may be baseline for Pt given reported IDD.    Collateral: TTS contacted group home staff member Raymond Rosario at [phone]. He states that he has been working with pt for over 3 years at group home and his behavior has escalated beyond control the last 2 months. He states that pt has ran away from group home almost every day the last 4 weeks. He states that pt normally runs away and comes back to the group home in a reasonable time frame but did not  do that this evening. He states pt refused to eat tonight and expressed he wanted to kill himself with a specific plan to run in traffic onto the highway. He states that pt has expressed passive SI thoughts in the past but never specified a plan until tonight. He states that pt does have hostroy of IDD, schizo-affective disorder and ADHD amongst other illnesses. He states he gave nurse list of  his current diagnosis and medications he is taking.  Diagnosis: Mental retardation         ADHD (attention-deficit hyperactivity disorder)  Past Medical History:  Past Medical History:  Diagnosis Date  . ADHD (attention deficit hyperactivity disorder)   . Mental retardation     No past surgical history on file.  Family History: No family history on file.  Social History:  reports that he has quit smoking. He has never used smokeless tobacco. He reports current alcohol use of about 1.0 standard drinks of alcohol per week. He reports that he does not use drugs.  Additional Social History:  Alcohol / Drug Use Pain Medications: SEE MAR Prescriptions: SEE MAR Over the Counter: SEE MAR  CIWA: CIWA-Ar BP: 139/79 Pulse Rate: 87 COWS:    Allergies: No Known Allergies  Home Medications: (Not in a hospital admission)   OB/GYN Status:  No LMP for male patient.  General Assessment Data Location of Assessment: [facility] TTS Assessment: In system Is this a Tele or Face-to-Face Assessment?:  Tele Assessment Is this an Initial Assessment or a Re-assessment for this encounter?: Initial Assessment Patient Accompanied by:: N/A Language Other than English: No Living Arrangements: In Group Home: (Comment: Name of [facility]) What gender do you identify as?: Male Marital status: Single Pregnancy Status: No Living Arrangements: Group Home Can pt return to current living arrangement?: Yes Admission Status: Voluntary Petitioner: Other Is patient capable of signing voluntary admission?: Yes Referral Source:  Other     Crisis Care Plan Living Arrangements: Group Home Legal Guardian: Mother  Education Status Is patient currently in school?: No Is the patient employed, unemployed or receiving disability?: Receiving disability income  Risk to self with the past 6 months Suicidal Ideation: Yes-Currently Present Has patient been a risk to self within the past 6 months prior to admission? : No Suicidal Intent: No Has patient had any suicidal intent within the past 6 months prior to admission? : No Is patient at risk for suicide?: No Suicidal Plan?: Yes-Currently Present Has patient had any suicidal plan within the past 6 months prior to admission? : No Specify Current Suicidal Plan: shoot sekf with gun Access to Means: No What has been your use of drugs/alcohol within the last 12 months?: none pt denied Previous Attempts/Gestures: No How many times?: 0 Other Self Harm Risks: (UTA) Triggers for Past Attempts: Unknown Intentional Self Injurious Behavior: None Family Suicide History: Unknown Recent stressful life event(s): (UTA) Persecutory voices/beliefs?: Raymond Rosario) Depression: (UTA) Depression Symptoms: (UTA) Substance abuse history and/or treatment for substance abuse?: No Suicide prevention information given to non-admitted patients: Not applicable  Risk to Others within the past 6 months Homicidal Ideation: No Does patient have any lifetime risk of violence toward others beyond the six months prior to admission? : No Thoughts of Harm to Others: No Current Homicidal Intent: No Current Homicidal Plan: No Access to Homicidal Means: No Identified Victim: none History of harm to others?: (UTA) Assessment of Violence: (UTA) Violent Behavior Description: (UTA) Does patient have access to weapons?: (UTA) Criminal Charges Pending?: (UTA) Does patient have a court date: (UTA) Is patient on probation?: (UTA)  Psychosis Hallucinations: (UTA) Delusions: (UTA)  Mental Status  Report Appearance/Hygiene: Other (Comment), Unremarkable Eye Contact: Fair Motor Activity: Freedom of movement Speech: Soft, Slow, Other (Comment) Level of Consciousness: Alert Mood: Euthymic Affect: Appropriate to circumstance Anxiety Level: None Thought Processes: Coherent Judgement: Impaired Orientation: Person, Place, Time, Situation Obsessive Compulsive Thoughts/Behaviors: None  Cognitive Functioning Concentration: Fair Memory: Remote Intact, Recent Intact Is patient IDD: Yes Level of Function: (unknown) Is IQ score available?: (unknown) Insight: Poor Impulse Control: Poor Appetite: Poor Have you had any weight changes? : (UTA) Sleep: Unable to Assess(UTA) Vegetative Symptoms: None  ADLScreening [facility] Assessment Services) Patient's cognitive ability adequate to safely complete daily activities?: Yes Patient able to express need for assistance with ADLs?: Yes Independently performs ADLs?: Yes (appropriate for developmental age)        ADL Screening (condition at time of admission) Patient's cognitive ability adequate to safely complete daily activities?: Yes Patient able to express need for assistance with ADLs?: Yes Independently performs ADLs?: Yes (appropriate for developmental age)          Disposition: Lindon Romp, FNP recommends overnight observation, reassess by psychiatry in the morning. Disposition Initial Assessment Completed for this Encounter: Yes  This service was provided via telemedicine using a 2-way, interactive audio and video technology.  Names of all persons participating in this telemedicine service and their role in this encounter. Name: Makel Mcmann Role:  Patient  Name: Lacey Jensen Role: TTS Counselor  Name:  Role:   Name:  Role    Natasha Mead 03/26/2019 8:55 PM

## 2019-03-26 NOTE — ED Triage Notes (Signed)
Pt here voluntarily with [employer] behavior health squad.  Patient states he is here because "I want to kill myself because I dont like my group home"

## 2019-03-26 NOTE — ED Provider Notes (Signed)
[place] DEPT Provider Note   CSN: [id number] Arrival date & time: 03/26/19  1846     History Chief Complaint  Patient presents with  . Suicidal    Raymond Rosario is a 24 y.o. male.  24 year old male with past medical history of ADHD, intellectual disability, intermittent explosive disorder, brought in by [facility] Department febrile health unit from group home for suicidal ideation.  Patient states that he is going to kill himself because he does not like the food at the group home.  Patient request to stay at the hospital and would like something to eat.  Patient reports taking daily medications and states he is compliant with his medications.  Patient does not have any other complaints or concerns today.        Past Medical History:  Diagnosis Date  . ADHD (attention deficit hyperactivity disorder)   . Mental retardation     Patient Active Problem List   Diagnosis Date Noted  . ADHD (attention deficit hyperactivity disorder), combined type 01/17/2016  . Intellectual disability 01/17/2016  . Intermittent explosive disorder 01/17/2016    No past surgical history on file.     No family history on file.  Social History   Tobacco Use  . Smoking status: Former Research scientist (life sciences)  . Smokeless tobacco: Never Used  Substance Use Topics  . Alcohol use: Yes    Alcohol/week: 1.0 standard drinks    Types: 1 Cans of beer per week    Comment: Pt consumed a beer today after eloping from group home  . Drug use: No    Home Medications Prior to Admission medications   Medication Sig Start Date End Date Taking? Authorizing Provider  cetirizine (ZYRTEC) 10 MG tablet Take 10 mg by mouth every morning.   Yes [provider]  Cholecalciferol (VITAMIN D3) 2000 units capsule Take 2,000 Units by mouth daily.   Yes [provider]  clonazePAM (KLONOPIN) 0.5 MG tablet Take 1 tablet (0.5 mg total) by mouth 2 (two) times daily. 09/02/17   Yes Raylene Everts, MD  desmopressin (DDAVP) 0.2 MG tablet Take 1-2 tablets (0.2-0.4 mg total) by mouth 2 (two) times daily. Take 1 tablet every morning and 2 tablets every night Patient taking differently: Take 0.4 mg by mouth at bedtime.  09/02/17  Yes Raylene Everts, MD  divalproex (DEPAKOTE) 250 MG DR tablet Take 250 mg by mouth 2 (two) times daily. Give with 500 mg tablets = 1250 mg/dose   Yes [provider]  divalproex (DEPAKOTE) 500 MG DR tablet Take 1,000 mg by mouth 2 (two) times daily. Give with 250 mg tablet = 1250 mg/dose   Yes [provider]  fesoterodine (TOVIAZ) 8 MG TB24 tablet Take 8 mg by mouth daily.   Yes [provider]  fluticasone (FLONASE) 50 MCG/ACT nasal spray Place 1 spray into both nostrils daily. 12/07/16  Yes [provider]  hydrochlorothiazide (HYDRODIURIL) 12.5 MG tablet Take 12.5 mg by mouth daily. 12/07/16  Yes [provider]  INVEGA SUSTENNA 234 MG/1.5ML SUSY injection Inject 1.5 mLs into the muscle every 30 (thirty) days. 02/06/19  Yes [provider]  propranolol (INDERAL) 20 MG tablet Take 1 tablet (20 mg total) by mouth 3 (three) times daily. 10/15/17  Yes Burnard Hawthorne, FNP  tamsulosin (FLOMAX) 0.4 MG CAPS capsule Take 0.4 mg by mouth daily after supper.   Yes [provider]  guaiFENesin (ROBITUSSIN) 100 MG/5ML liquid Take 5-10 mLs (100-200 mg total)  by mouth every 4 (four) hours as needed for cough. Patient not taking: Reported on 09/21/2017 05/20/17   Arthor Captain, PA-C    Allergies    Patient has no known allergies.  Review of Systems   Review of Systems  Unable to perform ROS: Psychiatric disorder  Respiratory: Negative for cough.   Cardiovascular: Negative for chest pain.  Gastrointestinal: Negative for abdominal pain and vomiting.  Psychiatric/Behavioral: Positive for suicidal ideas.    Physical Exam Updated Vital Signs BP 139/79 (BP Location: Left Arm)   Pulse 87    Temp 97.9 F (36.6 C) (Oral)   Resp 16   SpO2 99%   Physical Exam Vitals and nursing note reviewed.  Constitutional:      General: He is not in acute distress.    Appearance: He is well-developed. He is not diaphoretic.  HENT:     Head: Normocephalic and atraumatic.  Cardiovascular:     Rate and Rhythm: Normal rate and regular rhythm.     Pulses: Normal pulses.     Heart sounds: Normal heart sounds.  Pulmonary:     Effort: Pulmonary effort is normal.     Breath sounds: Normal breath sounds.  Abdominal:     Palpations: Abdomen is soft.     Tenderness: There is no abdominal tenderness.  Skin:    General: Skin is warm and dry.     Findings: No rash.  Neurological:     Mental Status: He is alert and oriented to person, place, and time.     Gait: Gait normal.  Psychiatric:        Mood and Affect: Mood is elated.        Behavior: Behavior normal.        Thought Content: Thought content includes suicidal ideation. Thought content does not include homicidal ideation.     ED Results / Procedures / Treatments   Labs (all labs ordered are listed, but only abnormal results are displayed) Labs Reviewed  COMPREHENSIVE METABOLIC PANEL - Abnormal; Notable for the following components:      Result Value   Glucose, Bld 117 (*)    All other components within normal limits  SARS CORONAVIRUS 2 (TAT 6-24 HRS)  ETHANOL  CBC WITH DIFFERENTIAL/PLATELET  RAPID URINE DRUG SCREEN, HOSP PERFORMED  VALPROIC ACID LEVEL    EKG EKG Interpretation  Date/Time:  Monday March 26 2019 20:58:17 EST Ventricular Rate:  82 PR Interval:  152 QRS Duration: 88 QT Interval:  374 QTC Calculation: 436 R Axis:   69 Text Interpretation: Normal sinus rhythm Nonspecific T wave abnormality Abnormal ECG No significant change since prior 7/19 Confirmed by Meridee Score [phone]) on 03/26/2019 9:13:42 PM   Radiology No results found.  Procedures Procedures (including critical care time)  Medications  Ordered in ED Medications  loratadine (CLARITIN) tablet 10 mg (has no administration in time range)  Vitamin D3 2,000 Units (has no administration in time range)  clonazePAM (KLONOPIN) tablet 0.5 mg (has no administration in time range)  desmopressin (DDAVP) tablet 0.4 mg (has no administration in time range)  divalproex (DEPAKOTE) DR tablet 250 mg (has no administration in time range)  divalproex (DEPAKOTE) DR tablet 1,000 mg (has no administration in time range)  fesoterodine (TOVIAZ) tablet 8 mg (has no administration in time range)  fluticasone (FLONASE) 50 MCG/ACT nasal spray 1 spray (has no administration in time range)  hydrochlorothiazide (HYDRODIURIL) tablet 12.5 mg (has no administration in time range)  propranolol (INDERAL) tablet 20 mg (has  no administration in time range)  tamsulosin (FLOMAX) capsule 0.4 mg (has no administration in time range)    ED Course  I have reviewed the triage vital signs and the nursing notes.  Pertinent labs & imaging results that were available during my care of the patient were reviewed by me and considered in my medical decision making (see chart for details).  Clinical Course as of Mar 25 2202  Mon Mar 26, 2019  2972 24 year old male brought in by [facility] voluntarily. Patient states he is suicidal because he does not like the food at the group home. Patient is happy, smiling, excited to be in the ER and would like to stay and eat. No other complaints tonight. Patient medically cleared for [facility] evaluation. CBC and CMP without significant findings, etoh negative. Awaiting BH evaluation and disposition.    [LM]    Clinical Course User Index [LM] Alden Hipp   MDM Rules/Calculators/A&P                      Final Clinical Impression(s) / ED Diagnoses Final diagnoses:  None    Rx / DC Orders ED Discharge Orders    None       Alden Hipp 03/26/19 2204    Glynn Octave, MD 03/26/19 2246

## 2019-03-27 ENCOUNTER — Encounter (HOSPITAL_COMMUNITY): Payer: Self-pay | Admitting: Registered Nurse

## 2019-03-27 DIAGNOSIS — R45851 Suicidal ideations: Secondary | ICD-10-CM

## 2019-03-27 DIAGNOSIS — F79 Unspecified intellectual disabilities: Secondary | ICD-10-CM

## 2019-03-27 DIAGNOSIS — F902 Attention-deficit hyperactivity disorder, combined type: Secondary | ICD-10-CM

## 2019-03-27 DIAGNOSIS — F6381 Intermittent explosive disorder: Secondary | ICD-10-CM

## 2019-03-27 LAB — RAPID URINE DRUG SCREEN, HOSP PERFORMED
Amphetamines: NOT DETECTED
Barbiturates: NOT DETECTED
Benzodiazepines: NOT DETECTED
Cocaine: NOT DETECTED
Opiates: NOT DETECTED
Tetrahydrocannabinol: NOT DETECTED

## 2019-03-27 LAB — SARS CORONAVIRUS 2 (TAT 6-24 HRS): SARS Coronavirus 2: NEGATIVE

## 2019-03-27 NOTE — Discharge Instructions (Signed)
For your behavioral health needs, you are advised to continue treatment with your current outpatient provider. °

## 2019-03-27 NOTE — Progress Notes (Signed)
CSW spoke with [facility] group home manager [phone]) to discuss patient going back to the group home. [place] reports patient can return and Mr. Elayne Guerin will be picking him up at 3pm. CSW also spoke with [facility], Loraine Grip [phone]), who reports they are looking for patient to go to another group home within a couple of days. They do understand patient has to return to the group home. CSW to fax patient's COVID results and psych eval to Ms. Daphine Deutscher.  Geralyn Corwin, LCSW Transitions of Care Department [facility] ED [phone]

## 2019-03-27 NOTE — BH Assessment (Signed)
[facility] Assessment Progress Note  Per Shuvon Rankin, FNP, this pt does not require psychiatric hospitalization at this time.  Pt is to be discharged from [facility] to return to his group home, and with recommendation to continue treatment with his current outpatient provider.  This has been included in pt's discharge instructions.  Shuvon reports that she will be ordering a social work consult to facilitate pt's return to his group home.  At 10:55 I called Geralyn Corwin, CSW to notify her.  Pt's nurse, Diane, has been notified.  Doylene Canning, MA Triage Specialist [phone]

## 2019-03-27 NOTE — Consult Note (Signed)
[facility] Psych ED Discharge  03/27/2019 11:50 AM Raymond Rosario  MRN:  [id number] Principal Problem: Intermittent explosive disorder Discharge Diagnoses: Principal Problem:   Intermittent explosive disorder Active Problems:   ADHD (attention deficit hyperactivity disorder), combined type   Intellectual disability   Subjective: Raymond Rosario, 24 y.o., male patient seen via tele psych by this provider, Dr. Lucianne Muss; and chart reviewed on 03/27/19.  On evaluation Raymond Rosario reports that he lives by himself and that he was brought into the hospital because he said he was going to kill himself and that he had again at home.  When confronted that he was not telling the truth patient started laughing.  Patient reports that he does live in a group home and that he has been leaving the home to walk to [facility] which is 15 minutes from the home.  Patient does not stay while I he goes to [facility] other than he does not like his group home.  Patient reports that he does not get into trouble while he is there.  Throughout the assessment patient was laughing and smiling with certain questions stating " I do not like group home.  I like the hospital.  I like you."  Patient denies suicidal/self-harm/homicidal ideations, psychosis, paranoia.  He is aware that he is in the hospital but not a good historian patient does have a history of an intellectual disability. During evaluation Raymond Rosario is alert/oriented x 4; calm/cooperative, and pleasant; and mood is congruent with affect.  He does not appear to be responding to internal/external stimuli or delusional thoughts.  Patient denies suicidal/self-harm/homicidal ideation, psychosis, and paranoia.  Patient answered question appropriately.     Total Time spent with patient: 30 minutes  Past Psychiatric History: Intellectual disability, intermittent explosive disorder, and ADHD  Past Medical History:  Past Medical History:  Diagnosis Date  . ADHD (attention  deficit hyperactivity disorder)   . Mental retardation    History reviewed. No pertinent surgical history. Family History: History reviewed. No pertinent family history. Family Psychiatric  History: Unaware Social History:  Social History   Substance and Sexual Activity  Alcohol Use Yes  . Alcohol/week: 1.0 standard drinks  . Types: 1 Cans of beer per week   Comment: Pt consumed a beer today after eloping from group home     Social History   Substance and Sexual Activity  Drug Use No    Social History   Socioeconomic History  . Marital status: Single    Spouse name: Not on file  . Number of children: Not on file  . Years of education: Not on file  . Highest education level: Not on file  Occupational History  . Occupation: Disabled  Tobacco Use  . Smoking status: Former Games developer  . Smokeless tobacco: Never Used  Substance and Sexual Activity  . Alcohol use: Yes    Alcohol/week: 1.0 standard drinks    Types: 1 Cans of beer per week    Comment: Pt consumed a beer today after eloping from group home  . Drug use: No  . Sexual activity: Never  Other Topics Concern  . Not on file  Social History Narrative   Pt lives at [facility].  Program Director is Blair Heys -- [phone]   Social Determinants of Health   Financial Resource Strain:   . Difficulty of Paying Living Expenses: Not on file  Food Insecurity:   . Worried About Programme researcher, broadcasting/film/video in the Last Year: Not on file  . Ran  Out of Food in the Last Year: Not on file  Transportation Needs:   . Lack of Transportation (Medical): Not on file  . Lack of Transportation (Non-Medical): Not on file  Physical Activity:   . Days of Exercise per Week: Not on file  . Minutes of Exercise per Session: Not on file  Stress:   . Feeling of Stress : Not on file  Social Connections:   . Frequency of Communication with Friends and Family: Not on file  . Frequency of Social Gatherings with Friends and Family: Not on file  .  Attends Religious Services: Not on file  . Active Member of Clubs or Organizations: Not on file  . Attends Banker Meetings: Not on file  . Marital Status: Not on file    Has this patient used any form of tobacco in the last 30 days? (Cigarettes, Smokeless Tobacco, Cigars, and/or Pipes) A prescription for an FDA-approved tobacco cessation medication was offered at discharge and the patient refused  Current Medications: Current Facility-Administered Medications  Medication Dose Route Frequency Provider Last Rate Last Admin  . cholecalciferol (VITAMIN D) tablet 2,000 Units  2,000 Units Oral Daily Jeannie Fend, PA-C   2,000 Units at 03/27/19 5956  . clonazePAM (KLONOPIN) tablet 0.5 mg  0.5 mg Oral BID Army Melia A, PA-C   0.5 mg at 03/27/19 3875  . desmopressin (DDAVP) tablet 0.4 mg  0.4 mg Oral QHS Army Melia A, PA-C      . divalproex (DEPAKOTE) DR tablet 1,000 mg  1,000 mg Oral BID Army Melia A, PA-C   1,000 mg at 03/27/19 6433  . divalproex (DEPAKOTE) DR tablet 250 mg  250 mg Oral BID Army Melia A, PA-C   250 mg at 03/27/19 2951  . fesoterodine (TOVIAZ) tablet 8 mg  8 mg Oral Daily Army Melia A, PA-C   8 mg at 03/27/19 8841  . fluticasone (FLONASE) 50 MCG/ACT nasal spray 1 spray  1 spray Each Nare Daily Jeannie Fend, PA-C   1 spray at 03/27/19 6606  . hydrochlorothiazide (HYDRODIURIL) tablet 12.5 mg  12.5 mg Oral Daily Army Melia A, PA-C   12.5 mg at 03/27/19 3016  . loratadine (CLARITIN) tablet 10 mg  10 mg Oral Daily Army Melia A, PA-C   10 mg at 03/27/19 0109  . propranolol (INDERAL) tablet 20 mg  20 mg Oral TID Army Melia A, PA-C   20 mg at 03/27/19 3235  . tamsulosin (FLOMAX) capsule 0.4 mg  0.4 mg Oral QPC supper Jeannie Fend, PA-C       Current Outpatient Medications  Medication Sig Dispense Refill  . cetirizine (ZYRTEC) 10 MG tablet Take 10 mg by mouth every morning.    . Cholecalciferol (VITAMIN D3) 2000 units capsule Take 2,000 Units  by mouth daily.    . clonazePAM (KLONOPIN) 0.5 MG tablet Take 1 tablet (0.5 mg total) by mouth 2 (two) times daily. 12 tablet 0  . desmopressin (DDAVP) 0.2 MG tablet Take 1-2 tablets (0.2-0.4 mg total) by mouth 2 (two) times daily. Take 1 tablet every morning and 2 tablets every night (Patient taking differently: Take 0.4 mg by mouth at bedtime. ) 21 tablet 0  . divalproex (DEPAKOTE) 250 MG DR tablet Take 250 mg by mouth 2 (two) times daily. Give with 500 mg tablets = 1250 mg/dose    . divalproex (DEPAKOTE) 500 MG DR tablet Take 1,000 mg by mouth 2 (two) times daily. Give with 250 mg tablet =  1250 mg/dose    . fesoterodine (TOVIAZ) 8 MG TB24 tablet Take 8 mg by mouth daily.    . fluticasone (FLONASE) 50 MCG/ACT nasal spray Place 1 spray into both nostrils daily.  0  . hydrochlorothiazide (HYDRODIURIL) 12.5 MG tablet Take 12.5 mg by mouth daily.  0  . INVEGA SUSTENNA 234 MG/1.5ML SUSY injection Inject 1.5 mLs into the muscle every 30 (thirty) days.    . propranolol (INDERAL) 20 MG tablet Take 1 tablet (20 mg total) by mouth 3 (three) times daily. 90 tablet 0  . tamsulosin (FLOMAX) 0.4 MG CAPS capsule Take 0.4 mg by mouth daily after supper.    Marland Kitchen guaiFENesin (ROBITUSSIN) 100 MG/5ML liquid Take 5-10 mLs (100-200 mg total) by mouth every 4 (four) hours as needed for cough. (Patient not taking: Reported on 09/21/2017) 60 mL 0   PTA Medications: (Not in a hospital admission)   Musculoskeletal: Strength & Muscle Tone: within normal limits Gait & Station: normal Patient leans: N/A  Psychiatric Specialty Exam: Physical Exam Vitals and nursing note reviewed.  Constitutional:      Appearance: Normal appearance.  Pulmonary:     Effort: Pulmonary effort is normal.  Skin:    Findings: Lesion:    Neurological:     Mental Status: He is alert.  Psychiatric:        Attention and Perception: He is attentive. He does not perceive auditory or visual hallucinations.        Mood and Affect: Mood normal.         Speech: Speech normal.        Behavior: Behavior normal.        Thought Content: Thought content is not paranoid or delusional. Thought content does not include homicidal or suicidal ideation.        Cognition and Memory: Memory normal.        Judgment: Judgment is impulsive.     Comments: History of IDD     Review of Systems  Psychiatric/Behavioral: Negative for agitation, confusion, decreased concentration, hallucinations, self-injury, sleep disturbance and suicidal ideas. The patient is not nervous/anxious.   All other systems reviewed and are negative.   Blood pressure (!) 144/101, pulse (!) 56, temperature 97.9 F (36.6 C), temperature source Oral, resp. rate 18, SpO2 100 %.There is no height or weight on file to calculate BMI.  General Appearance: Casual  Eye Contact:  Good  Speech:  Clear and Coherent and Normal Rate  Volume:  Normal  Mood:  " I am fine" appropriate  Affect:  Appropriate  Thought Process:  Coherent and Goal Directed  Orientation:  Full (Time, Place, and Person)  Thought Content:  WDL  Suicidal Thoughts:  No  Homicidal Thoughts:  No  Memory:  Immediate;   Fair Recent;   Fair  Judgement:  Fair  Insight:  Present  Psychomotor Activity:  Normal  Concentration:  Concentration: Fair and Attention Span: Fair  Recall:  [employer] of Knowledge:  Fair  Language:  Poor  Akathisia:  No  Handed:  Right  AIMS (if indicated):     Assets:  Communication Skills Housing Social Support  ADL's:  Intact  Cognition:  Impaired,  Mild  Sleep:        Demographic Factors:  Male  Loss Factors: NA  Historical Factors: Impulsivity  Risk Reduction Factors:   Religious beliefs about death, Living with another person, especially a relative and Positive social support  Continued Clinical Symptoms:  IDD  Cognitive Features That  Contribute To Risk:  Loss of executive function    Suicide Risk:  Minimal: No identifiable suicidal ideation.  Patients presenting  with no risk factors but with morbid ruminations; may be classified as minimal risk based on the severity of the depressive symptoms    Plan Of Care/Follow-up recommendations:  Activity:  As tolerated Diet:  Heart healthy     Discharge Instructions     For your behavioral health needs, you are advised to continue treatment with your current outpatient provider.    Disposition: Patient psychiatrically cleared No evidence of imminent risk to self or others at present.   Patient does not meet criteria for psychiatric inpatient admission. Refer to IOP. Discussed crisis plan, support from social network, calling 911, coming to the Emergency Department, and calling Suicide Hotline.  Preslei Blakley, NP 03/27/2019, 11:50 AM

## 2019-03-29 ENCOUNTER — Ambulatory Visit (HOSPITAL_COMMUNITY)
Admission: AD | Admit: 2019-03-29 | Discharge: 2019-03-29 | Disposition: A | Discharge status: Home / Self Care | Payer: Medicaid Other | Attending: Psychiatry | Admitting: Psychiatry

## 2019-03-29 ENCOUNTER — Encounter (HOSPITAL_COMMUNITY): Payer: Self-pay

## 2019-03-29 ENCOUNTER — Other Ambulatory Visit: Payer: Self-pay

## 2019-03-29 ENCOUNTER — Emergency Department (HOSPITAL_COMMUNITY)
Admission: EM | Admit: 2019-03-29 | Discharge: 2019-03-30 | Disposition: A | Discharge status: Home / Self Care | Payer: Medicaid Other | Attending: Emergency Medicine | Admitting: Emergency Medicine

## 2019-03-29 DIAGNOSIS — F79 Unspecified intellectual disabilities: Secondary | ICD-10-CM | POA: Insufficient documentation

## 2019-03-29 DIAGNOSIS — F329 Major depressive disorder, single episode, unspecified: Secondary | ICD-10-CM | POA: Insufficient documentation

## 2019-03-29 DIAGNOSIS — F902 Attention-deficit hyperactivity disorder, combined type: Secondary | ICD-10-CM | POA: Insufficient documentation

## 2019-03-29 DIAGNOSIS — Z87891 Personal history of nicotine dependence: Secondary | ICD-10-CM | POA: Insufficient documentation

## 2019-03-29 DIAGNOSIS — Z7289 Other problems related to lifestyle: Secondary | ICD-10-CM | POA: Insufficient documentation

## 2019-03-29 DIAGNOSIS — Z79899 Other long term (current) drug therapy: Secondary | ICD-10-CM | POA: Diagnosis not present

## 2019-03-29 DIAGNOSIS — F259 Schizoaffective disorder, unspecified: Secondary | ICD-10-CM | POA: Diagnosis not present

## 2019-03-29 DIAGNOSIS — R45851 Suicidal ideations: Secondary | ICD-10-CM

## 2019-03-29 LAB — COMPREHENSIVE METABOLIC PANEL
ALT: 20 U/L (ref 0–44)
AST: 22 U/L (ref 15–41)
Albumin: 3.9 g/dL (ref 3.5–5.0)
Alkaline Phosphatase: 62 U/L (ref 38–126)
Anion gap: 11 (ref 5–15)
BUN: 11 mg/dL (ref 6–20)
CO2: 25 mmol/L (ref 22–32)
Calcium: 9.2 mg/dL (ref 8.9–10.3)
Chloride: 100 mmol/L (ref 98–111)
Creatinine, Ser: 0.91 mg/dL (ref 0.61–1.24)
GFR calc Af Amer: >60 mL/min (ref >60)
GFR calc non Af Amer: >60 mL/min (ref >60)
Glucose, Bld: 103 mg/dL — ABNORMAL HIGH (ref 70–99)
Potassium: 3.9 mmol/L (ref 3.5–5.1)
Sodium: 136 mmol/L (ref 135–145)
Total Bilirubin: 0.8 mg/dL (ref 0.3–1.2)
Total Protein: 7.3 g/dL (ref 6.5–8.1)

## 2019-03-29 LAB — CBC
HCT: 43.2 % (ref 39.0–52.0)
Hemoglobin: 14.2 g/dL (ref 13.0–17.0)
MCH: 26.7 pg (ref 26.0–34.0)
MCHC: 32.9 g/dL (ref 30.0–36.0)
MCV: 81.2 fL (ref 80.0–100.0)
Platelets: 270 10*3/uL (ref 150–400)
RBC: 5.32 MIL/uL (ref 4.22–5.81)
RDW: 14.3 % (ref 11.5–15.5)
WBC: 7.6 10*3/uL (ref 4.0–10.5)
nRBC: 0 % (ref 0.0–0.2)

## 2019-03-29 LAB — SALICYLATE LEVEL: Salicylate Lvl: <7 mg/dL — ABNORMAL LOW (ref 7.0–30.0)

## 2019-03-29 LAB — ACETAMINOPHEN LEVEL: Acetaminophen (Tylenol), Serum: <10 ug/mL — ABNORMAL LOW (ref 10–30)

## 2019-03-29 LAB — ETHANOL: Alcohol, Ethyl (B): <10 mg/dL (ref <10)

## 2019-03-29 NOTE — BH Assessment (Addendum)
Assessment Note  Raymond Rosario is an 24 y.o. male. Pt presented to [facility] as a walk in accompanied by his case manager Renford Dills.  Pt's case manager states that pt is depressed and that he is running away from group home repeatedly. Pt was seen by TTS on 03/25/2018 and 03/23/18 for similar presentation. Per pt chart history has been running away from a group last several weeks and increased in non compliant behavior such as drinking alcohol, fighting staff, verbal arguments with staff and even violence towards staff and others. Pt case manager states that pt has also lit cigarettes in the group home, fought staff and has been involved with people out in the community that negatively influences him. She also states that pt has suffered from multiple losses's over the years , such as friend dying from Gideon this year. Pt was asked about ideations, pt states he currently feels suicidal and wants to cut self with a knife but denied having access to weapons. Pt denied HI, self injurious behaviors. Pt reports we has seen and heard things today such as a "devil" and "someone trying to hurt him, voices telling him to do so but was smiling when he answered question about AVH and did not understand the question at first. Pt's case manager states that [facility] for [facility] with severe IDD. She states he is currently med compliant and see a therapist once a week.    Pt oriented x3. During assessment, Pt presented as alert and oriented.  He had fair eye contact.  Pt's mood was euthymic.  Affect was pleasant.  Speech was slow, soft, and difficult to understand.  Pt's thought processes were within normal range, and thought content was logical.  There was no evidence of delusion.  Pt's memory and concentration were fair.  Insight, judgment, and impulse control were fair to poor as evidenced by elopement.  This may be baseline for Pt given reported IDD.  Diagnosis: Mental retardation     Schizo-affective disorder    Past Medical History:  Past Medical History:  Diagnosis Date  . ADHD (attention deficit hyperactivity disorder)   . Mental retardation     No past surgical history on file.  Family History: No family history on file.  Social History:  reports that he has quit smoking. He has never used smokeless tobacco. He reports current alcohol use of about 1.0 standard drinks of alcohol per week. He reports that he does not use drugs.  Additional Social History:  Alcohol / Drug Use Pain Medications: see MAR Prescriptions: see MAR Over the Counter: see MAR  CIWA:   COWS:    Allergies: No Known Allergies  Home Medications: (Not in a hospital admission)   OB/GYN Status:  No LMP for male patient.  General Assessment Data Location of Assessment: [facility] Assessment Services TTS Assessment: In system Is this a Tele or Face-to-Face Assessment?: Face-to-Face Is this an Initial Assessment or a Re-assessment for this encounter?: Initial Assessment Patient Accompanied by:: N/A Language Other than English: No Living Arrangements: In Group Home: (Comment: Name of [facility]) What gender do you identify as?: Male Marital status: Single Pregnancy Status: No Living Arrangements: Group Home Can pt return to current living arrangement?: Yes Admission Status: Voluntary Petitioner: Other Is patient capable of signing voluntary admission?: Yes Referral Source: Self/Family/Friend     Crisis Care Plan Living Arrangements: Group Home Legal Guardian: Mother  Education Status Is patient currently in school?: No Is  the patient employed, unemployed or receiving disability?: Receiving disability income  Risk to self with the past 6 months Suicidal Ideation: Yes-Currently Present Has patient been a risk to self within the past 6 months prior to admission? : No Suicidal Intent: No Has patient had any suicidal intent within the past 6 months prior to admission? : No Is  patient at risk for suicide?: No Suicidal Plan?: No Has patient had any suicidal plan within the past 6 months prior to admission? : No Specify Current Suicidal Plan: to cut self with knife Access to Means: No What has been your use of drugs/alcohol within the last 12 months?: beer Previous Attempts/Gestures: No How many times?: 0 Triggers for Past Attempts: Unknown Intentional Self Injurious Behavior: None Family Suicide History: Unknown Recent stressful life event(s): Other (Comment) Persecutory voices/beliefs?: No Depression: (UTA) Depression Symptoms: (UTA) Substance abuse history and/or treatment for substance abuse?: No Suicide prevention information given to non-admitted patients: Not applicable  Risk to Others within the past 6 months Homicidal Ideation: No Does patient have any lifetime risk of violence toward others beyond the six months prior to admission? : No Thoughts of Harm to Others: No Current Homicidal Intent: No Current Homicidal Plan: No Access to Homicidal Means: No Identified Victim: no History of harm to others?: No Assessment of Violence: None Noted Does patient have access to weapons?: No Criminal Charges Pending?: No Does patient have a court date: No Is patient on probation?: No  Psychosis Hallucinations: None noted Delusions: None noted  Mental Status Report Appearance/Hygiene: Other (Comment), Unremarkable Eye Contact: Fair Motor Activity: Freedom of movement Speech: Soft, Slow, Other (Comment) Level of Consciousness: Alert Mood: Euthymic Affect: Appropriate to circumstance Anxiety Level: None Thought Processes: Coherent Judgement: Impaired Obsessive Compulsive Thoughts/Behaviors: None  Cognitive Functioning Concentration: Fair Memory: Recent Intact Is patient IDD: Yes Level of Function: severe Is IQ score available?: (unknown) Insight: Poor Impulse Control: Poor Appetite: Poor Have you had any weight changes? : Loss Sleep: No  Change Vegetative Symptoms: None  ADLScreening [facility] Assessment Services) Patient's cognitive ability adequate to safely complete daily activities?: Yes Patient able to express need for assistance with ADLs?: Yes Independently performs ADLs?: Yes (appropriate for developmental age)  Prior Inpatient Therapy Prior Inpatient Therapy: No  Prior Outpatient Therapy Prior Outpatient Therapy: Yes Does patient have an ACCT team?: No Does patient have Intensive In-House Services?  : No Does patient have [facility] services? : No Does patient have P4CC services?: No  ADL Screening (condition at time of admission) Patient's cognitive ability adequate to safely complete daily activities?: Yes Patient able to express need for assistance with ADLs?: Yes Independently performs ADLs?: Yes (appropriate for developmental age)                        Disposition: Adaku, Victory Dakin, FNP recommends overnight observation, reassess by psychiatry in the morning Disposition Initial Assessment Completed for this Encounter: Yes  On Site Evaluation by:  Lacey Jensen, Theresia Majors Reviewed with Physician:  Renaye Rakers, FNP   Claria Dice Kaitlyn Skowron 03/29/2019 9:10 PM

## 2019-03-29 NOTE — ED Provider Notes (Signed)
[name] Note   CSN: [id number] Arrival date & time: 03/29/19  2130     History Chief Complaint  Patient presents with  . Medical Clearance    Raymond Rosario is a 24 y.o. male.  24 yo M with a chief complaints of suicidal ideation.  Patient was seen earlier in the week for similar complaint.  He denies any medical complaint denies chest pain shortness of breath abdominal pain vomiting fever.  The history is provided by the patient.  Illness Severity:  Moderate Onset quality:  Gradual Duration:  2 weeks Timing:  Constant Progression:  Worsening Chronicity:  New Associated symptoms: no abdominal pain, no chest pain, no congestion, no diarrhea, no fever, no headaches, no myalgias, no rash, no shortness of breath and no vomiting        Past Medical History:  Diagnosis Date  . ADHD (attention deficit hyperactivity disorder)   . Mental retardation     Patient Active Problem List   Diagnosis Date Noted  . Suicidal thoughts   . ADHD (attention deficit hyperactivity disorder), combined type 01/17/2016  . Intellectual disability 01/17/2016  . Intermittent explosive disorder 01/17/2016    History reviewed. No pertinent surgical history.     History reviewed. No pertinent family history.  Social History   Tobacco Use  . Smoking status: Former Research scientist (life sciences)  . Smokeless tobacco: Never Used  Substance Use Topics  . Alcohol use: Yes    Alcohol/week: 1.0 standard drinks    Types: 1 Cans of beer per week    Comment: Pt consumed a beer today after eloping from group home  . Drug use: No    Home Medications Prior to Admission medications   Medication Sig Start Date End Date Taking? Authorizing Provider  cetirizine (ZYRTEC) 10 MG tablet Take 10 mg by mouth every morning.    [provider]  Cholecalciferol (VITAMIN D3) 2000 units capsule Take 2,000 Units by mouth daily.    [provider]  clonazePAM (KLONOPIN)  0.5 MG tablet Take 1 tablet (0.5 mg total) by mouth 2 (two) times daily. 09/02/17   Raylene Everts, MD  desmopressin (DDAVP) 0.2 MG tablet Take 1-2 tablets (0.2-0.4 mg total) by mouth 2 (two) times daily. Take 1 tablet every morning and 2 tablets every night Patient taking differently: Take 0.4 mg by mouth at bedtime.  09/02/17   Raylene Everts, MD  divalproex (DEPAKOTE) 250 MG DR tablet Take 250 mg by mouth 2 (two) times daily. Give with 500 mg tablets = 1250 mg/dose    [provider]  divalproex (DEPAKOTE) 500 MG DR tablet Take 1,000 mg by mouth 2 (two) times daily. Give with 250 mg tablet = 1250 mg/dose    [provider]  fesoterodine (TOVIAZ) 8 MG TB24 tablet Take 8 mg by mouth daily.    [provider]  fluticasone (FLONASE) 50 MCG/ACT nasal spray Place 1 spray into both nostrils daily. 12/07/16   [provider]  guaiFENesin (ROBITUSSIN) 100 MG/5ML liquid Take 5-10 mLs (100-200 mg total) by mouth every 4 (four) hours as needed for cough. Patient not taking: Reported on 09/21/2017 05/20/17   Margarita Mail, PA-C  hydrochlorothiazide (HYDRODIURIL) 12.5 MG tablet Take 12.5 mg by mouth daily. 12/07/16   [provider]  INVEGA SUSTENNA 234 MG/1.5ML SUSY injection Inject 1.5 mLs into the muscle every 30 (thirty) days. 02/06/19   [provider]  propranolol (INDERAL) 20 MG tablet Take 1 tablet (20 mg  total) by mouth 3 (three) times daily. 10/15/17   Allegra Grana, FNP  tamsulosin (FLOMAX) 0.4 MG CAPS capsule Take 0.4 mg by mouth daily after supper.    [provider]    Allergies    Patient has no known allergies.  Review of Systems   Review of Systems  Constitutional: Negative for chills and fever.  HENT: Negative for congestion and facial swelling.   Eyes: Negative for discharge and visual disturbance.  Respiratory: Negative for shortness of breath.   Cardiovascular: Negative for chest pain and palpitations.    Gastrointestinal: Negative for abdominal pain, diarrhea and vomiting.  Musculoskeletal: Negative for arthralgias and myalgias.  Skin: Negative for color change and rash.  Neurological: Negative for tremors, syncope and headaches.  Psychiatric/Behavioral: Positive for suicidal ideas. Negative for confusion and dysphoric mood.    Physical Exam Updated Vital Signs BP 112/67 (BP Location: Right Arm)   Pulse 68   Temp 98.7 F (37.1 C) (Oral)   SpO2 100%   Physical Exam Vitals and nursing note reviewed.  Constitutional:      Appearance: He is well-developed.  HENT:     Head: Normocephalic and atraumatic.  Eyes:     Pupils: Pupils are equal, round, and reactive to light.  Neck:     Vascular: No JVD.  Cardiovascular:     Rate and Rhythm: Normal rate and regular rhythm.     Heart sounds: No murmur. No friction rub. No gallop.   Pulmonary:     Effort: No respiratory distress.     Breath sounds: No wheezing.  Abdominal:     General: There is no distension.     Tenderness: There is no guarding or rebound.  Musculoskeletal:        General: Normal range of motion.     Cervical back: Normal range of motion and neck supple.  Skin:    Coloration: Skin is not pale.     Findings: No rash.  Neurological:     Mental Status: He is alert and oriented to person, place, and time.  Psychiatric:        Behavior: Behavior normal.        Thought Content: Thought content includes suicidal ideation.     ED Results / Procedures / Treatments   Labs (all labs ordered are listed, but only abnormal results are displayed) Labs Reviewed  COMPREHENSIVE METABOLIC PANEL  ETHANOL  SALICYLATE LEVEL  ACETAMINOPHEN LEVEL  CBC  RAPID URINE DRUG SCREEN, HOSP PERFORMED    EKG None  Radiology No results found.  Procedures Procedures (including critical care time)  Medications Ordered in ED Medications - No data to display  ED Course  I have reviewed the triage vital signs and the nursing  notes.  Pertinent labs & imaging results that were available during my care of the patient were reviewed by me and considered in my medical decision making (see chart for details).    MDM Rules/Calculators/A&P                      24 yo M with a chief complaints of suicidal ideation.  Patient was seen earlier this week for the same.  He has already been seen by the psychiatrist prior to my evaluation who recommended overnight observation and reassessment in the morning.  The patients results and plan were reviewed and discussed.   Any x-rays performed were independently reviewed by myself.   Differential diagnosis were considered with the presenting  HPI.  Medications - No data to display  Vitals:   03/29/19 2144  BP: 112/67  Pulse: 68  Temp: 98.7 F (37.1 C)  TempSrc: Oral  SpO2: 100%    Final diagnoses:  Suicidal ideation      Final Clinical Impression(s) / ED Diagnoses Final diagnoses:  Suicidal ideation    Rx / DC Orders ED Discharge Orders    None       Melene Plan, DO 03/29/19 2226

## 2019-03-29 NOTE — H&P (Signed)
Behavioral Health Medical Screening Exam  Raymond Rosario is an 24 y.o. male presents voluntarily as a walk-in accompanied by his case [name]. Per case manager, pt is depressed and has been running away from the group home about 4-5 times a day. She reports pt has been verbally aggressive, fighting staff, violent, and drinking. She reports that patient is being enabled by the neighbors who buys him alcohol and cigrattes. She reports that patient experienced some deaths in the past and most recently his friend in the neighborhood died from [place] and this is a major stressor for patient. She states that the group home is having major challenges controlling pt's behavior and the police has been repeatedly called for pt's elopement. Pt was recently [facility] at [facility] on 03/25/2018 for similar presentation. Pt reports he is suicidal with plans to hurt himself with a knife. Per case manager, pt has no access to guns/weapons. Pt denies HI but states he sees "the devil" and hears someone trying to "hurt him". Pt's case manager states that [facility] is [facility] to [facility] for individuals with severe IDD. She states that pt sees his therapist once weekly and is compliant with his medication.   During evaluation pt is sitting; he is alert/oriented x 2; calm/cooperative; and mood is depressed non-congruent with affect. Patient is speech is slurred at moderate volume, and slow pace; with fair eye contact. His thought process is coherent and relevant; there is no indication that he is currently responding to internal/external stimuli or experiencing delusional thought content. Pt insight, judgement and impulse control is poor. Patient has remained calm throughout assessment.   Total Time spent with patient: 30 minutes  Psychiatric Specialty Exam: Physical Exam  Constitutional: He appears well-nourished.  HENT:  Head: Normocephalic.  Eyes: Pupils are equal, round, and reactive to light.  Respiratory: Effort  normal.  Musculoskeletal:        General: Normal range of motion.     Cervical back: Normal range of motion.  Neurological: He is alert.  Skin: Skin is warm and dry.  Psychiatric: His behavior is normal. His speech is delayed. Cognition and memory are normal. He expresses impulsivity. He exhibits a depressed mood. He expresses suicidal ideation.    Review of Systems  Psychiatric/Behavioral: Positive for behavioral problems and suicidal ideas. Negative for agitation, decreased concentration and hallucinations. The patient is not nervous/anxious.   All other systems reviewed and are negative.   Blood pressure 125/87, pulse 92, temperature 98.5 F (36.9 C), temperature source Oral, resp. rate 16, SpO2 100 %.There is no height or weight on file to calculate BMI.  General Appearance: Casual  Eye Contact:  Minimal  Speech:  Slurred  Volume:  Normal  Mood:  Depressed  Affect:  Non-Congruent  Thought Process:  Coherent and Descriptions of Associations: Intact  Orientation:  Other:  Pt has mental retardation  Thought Content:  Illogical  Suicidal Thoughts:  Yes.  with intent/plan  Homicidal Thoughts:  No  Memory:  Recent;   Fair  Judgement:  Impaired  Insight:  Lacking  Psychomotor Activity:  Normal  Concentration: Concentration: Fair  Recall:  [place] of Knowledge:Good  Language: Good  Akathisia:  No  Handed:  Right  AIMS (if indicated):     Assets:  Agricultural consultant Housing Social Support  Sleep:       Musculoskeletal: Strength & Muscle Tone: within normal limits Gait & Station: normal Patient leans: N/A  Blood pressure 125/87, pulse  92, temperature 98.5 F (36.9 C), temperature source Oral, resp. rate 16, SpO2 100 %.  Recommendations:  Based on my evaluation the patient does not appear to have an emergency medical condition.   Disposition: Supportive therapy provided about ongoing stressors. Recommend overnight observation for  monitoring and stabilization  Leeah Politano C Khalani Novoa, NP 03/29/2019, 10:59 PM

## 2019-03-29 NOTE — ED Triage Notes (Signed)
Pt reports that he does not like his food at his group home. He is requesting a room here. States that he is going to kill himself if he has to keep living there. He has no plan and is requesting dinner.

## 2019-03-30 LAB — RAPID URINE DRUG SCREEN, HOSP PERFORMED
Amphetamines: NOT DETECTED
Barbiturates: NOT DETECTED
Benzodiazepines: NOT DETECTED
Cocaine: NOT DETECTED
Opiates: NOT DETECTED
Tetrahydrocannabinol: NOT DETECTED

## 2019-03-30 MED ORDER — ONDANSETRON 4 MG PO TBDP: 4.0000 mg | ORAL_TABLET | Freq: Three times a day (TID) | ORAL | Status: DC | PRN

## 2019-03-30 NOTE — Progress Notes (Signed)
Received Raymond Rosario from the main Ed at 2200 hrs. He was oriented to his new environment and changed into purple scrubs.  He endorsed feeling suicidal upon arrival to TCU. He was given a snack of his choice. He eventually drifted off to sleep. The sitter is at the bedside.

## 2019-03-30 NOTE — Discharge Instructions (Signed)
For your behavioral health needs, you are advised to follow up with your regular outpatient provider. 

## 2019-03-30 NOTE — BH Assessment (Signed)
[facility] Assessment Progress Note  Per Berneice Heinrich, FNP, this pt does not require psychiatric hospitalization at this time.  Pt is to be discharged from [facility] with recommendation to follow up with his regular outpatient provider.  This has been included in pt's discharge instructions.  A social work consult has been ordered to facilitate pt's return to his group home.  Pt's nurse, Melody, has been notified.  Doylene Canning, MA Triage Specialist [phone]

## 2019-03-30 NOTE — ED Notes (Signed)
Patients caregiver French Ana is to call us back after a meeting she is attending.  She is unaware that patient is discharged.

## 2019-03-30 NOTE — ED Notes (Signed)
Pt discharged safely with mother.

## 2019-03-30 NOTE — Progress Notes (Signed)
CSW spoke with Hyman Bible who reports patient's mother is coming from [facility] to pick patient up. CSW spoke with patient's mother Markise Haymer who reports she is getting on the road to come pick patient up and should be here around 4:30p. Ms. Vital can be reached at [phone].   Geralyn Corwin, LCSW Transitions of Care Department [facility] ED [phone]
